# Patient Record
Sex: Female | Born: 1970
Health system: Southern US, Community
[De-identification: ages and names within clinical notes are randomized; demographics above are authoritative.]

## PROBLEM LIST (undated history)

## (undated) DIAGNOSIS — D649 Anemia, unspecified: Secondary | ICD-10-CM

## (undated) DIAGNOSIS — F419 Anxiety disorder, unspecified: Secondary | ICD-10-CM

## (undated) DIAGNOSIS — M069 Rheumatoid arthritis, unspecified: Secondary | ICD-10-CM

## (undated) DIAGNOSIS — Z803 Family history of malignant neoplasm of breast: Secondary | ICD-10-CM

## (undated) DIAGNOSIS — Z923 Personal history of irradiation: Secondary | ICD-10-CM

## (undated) DIAGNOSIS — C50911 Malignant neoplasm of unspecified site of right female breast: Secondary | ICD-10-CM

## (undated) DIAGNOSIS — Z800 Family history of malignant neoplasm of digestive organs: Secondary | ICD-10-CM

## (undated) DIAGNOSIS — R112 Nausea with vomiting, unspecified: Secondary | ICD-10-CM

## (undated) DIAGNOSIS — R630 Anorexia: Secondary | ICD-10-CM

## (undated) DIAGNOSIS — Z170 Estrogen receptor positive status [ER+]: Secondary | ICD-10-CM

## (undated) DIAGNOSIS — Z9889 Other specified postprocedural states: Secondary | ICD-10-CM

## (undated) DIAGNOSIS — C50919 Malignant neoplasm of unspecified site of unspecified female breast: Secondary | ICD-10-CM

## (undated) HISTORY — DX: Estrogen receptor positive status (ER+): Z17.0

## (undated) HISTORY — DX: Anemia, unspecified: D64.9

## (undated) HISTORY — DX: Family history of malignant neoplasm of breast: Z80.3

## (undated) HISTORY — DX: Anorexia: R63.0

## (undated) HISTORY — DX: Anxiety disorder, unspecified: F41.9

## (undated) HISTORY — DX: Rheumatoid arthritis, unspecified: M06.9

## (undated) HISTORY — DX: Malignant neoplasm of unspecified site of right female breast: C50.911

## (undated) HISTORY — DX: Nausea with vomiting, unspecified: R11.2

## (undated) HISTORY — DX: Family history of malignant neoplasm of digestive organs: Z80.0

---

## 1997-07-11 HISTORY — PX: COLPOSCOPY: SHX161

## 2000-08-21 ENCOUNTER — Other Ambulatory Visit: Admission: RE | Admit: 2000-08-21 | Discharge: 2000-08-21 | Payer: Self-pay | Admitting: Obstetrics and Gynecology

## 2001-08-21 ENCOUNTER — Other Ambulatory Visit: Admission: RE | Admit: 2001-08-21 | Discharge: 2001-08-21 | Payer: Self-pay | Admitting: Obstetrics and Gynecology

## 2002-10-14 ENCOUNTER — Other Ambulatory Visit: Admission: RE | Admit: 2002-10-14 | Discharge: 2002-10-14 | Payer: Self-pay | Admitting: Obstetrics and Gynecology

## 2002-10-21 ENCOUNTER — Encounter: Payer: Self-pay | Admitting: Obstetrics and Gynecology

## 2002-10-21 ENCOUNTER — Encounter: Admission: RE | Admit: 2002-10-21 | Discharge: 2002-10-21 | Payer: Self-pay | Admitting: Obstetrics and Gynecology

## 2003-10-27 ENCOUNTER — Other Ambulatory Visit: Admission: RE | Admit: 2003-10-27 | Discharge: 2003-10-27 | Payer: Self-pay | Admitting: Obstetrics and Gynecology

## 2004-10-29 ENCOUNTER — Other Ambulatory Visit: Admission: RE | Admit: 2004-10-29 | Discharge: 2004-10-29 | Payer: Self-pay | Admitting: Obstetrics and Gynecology

## 2005-11-01 ENCOUNTER — Other Ambulatory Visit: Admission: RE | Admit: 2005-11-01 | Discharge: 2005-11-01 | Payer: Self-pay | Admitting: Obstetrics and Gynecology

## 2006-04-11 ENCOUNTER — Ambulatory Visit (HOSPITAL_COMMUNITY): Admission: RE | Admit: 2006-04-11 | Discharge: 2006-04-11 | Payer: Self-pay | Admitting: *Deleted

## 2006-04-11 ENCOUNTER — Encounter (INDEPENDENT_AMBULATORY_CARE_PROVIDER_SITE_OTHER): Payer: Self-pay | Admitting: *Deleted

## 2006-04-13 ENCOUNTER — Other Ambulatory Visit: Admission: RE | Admit: 2006-04-13 | Discharge: 2006-04-13 | Payer: Self-pay | Admitting: Obstetrics & Gynecology

## 2007-08-30 ENCOUNTER — Other Ambulatory Visit: Admission: RE | Admit: 2007-08-30 | Discharge: 2007-08-30 | Payer: Self-pay | Admitting: Obstetrics & Gynecology

## 2008-09-04 ENCOUNTER — Other Ambulatory Visit: Admission: RE | Admit: 2008-09-04 | Discharge: 2008-09-04 | Payer: Self-pay | Admitting: Obstetrics & Gynecology

## 2009-05-11 HISTORY — PX: HYSTEROSCOPY WITH RESECTOSCOPE: SHX5395

## 2009-05-25 ENCOUNTER — Encounter: Payer: Self-pay | Admitting: Obstetrics & Gynecology

## 2009-05-25 ENCOUNTER — Ambulatory Visit (HOSPITAL_COMMUNITY): Admission: RE | Admit: 2009-05-25 | Discharge: 2009-05-25 | Payer: Self-pay | Admitting: Obstetrics & Gynecology

## 2010-05-14 ENCOUNTER — Encounter: Admission: RE | Admit: 2010-05-14 | Discharge: 2010-05-14 | Payer: Self-pay | Admitting: Obstetrics & Gynecology

## 2010-10-10 HISTORY — PX: APPENDECTOMY: SHX54

## 2010-10-13 LAB — URINALYSIS, ROUTINE W REFLEX MICROSCOPIC
Bilirubin Urine: NEGATIVE
Hgb urine dipstick: NEGATIVE
Nitrite: NEGATIVE
Protein, ur: NEGATIVE mg/dL
Specific Gravity, Urine: <1.005 — ABNORMAL LOW (ref 1.005–1.030)
Urobilinogen, UA: 0.2 mg/dL (ref 0.0–1.0)

## 2010-10-13 LAB — CBC
HCT: 40.2 % (ref 36.0–46.0)
Platelets: 176 10*3/uL (ref 150–400)
RDW: 13 % (ref 11.5–15.5)
WBC: 4.3 10*3/uL (ref 4.0–10.5)

## 2010-10-13 LAB — HCG, SERUM, QUALITATIVE: Preg, Serum: NEGATIVE

## 2010-11-04 ENCOUNTER — Other Ambulatory Visit: Payer: Self-pay | Admitting: General Surgery

## 2010-11-04 ENCOUNTER — Observation Stay (HOSPITAL_COMMUNITY)
Admission: EM | Admit: 2010-11-04 | Discharge: 2010-11-05 | Disposition: A | Discharge status: Home / Self Care | Payer: Managed Care, Other (non HMO) | Attending: General Surgery | Admitting: General Surgery

## 2010-11-04 DIAGNOSIS — M069 Rheumatoid arthritis, unspecified: Secondary | ICD-10-CM | POA: Insufficient documentation

## 2010-11-04 DIAGNOSIS — K3580 Unspecified acute appendicitis: Principal | ICD-10-CM | POA: Insufficient documentation

## 2010-11-04 DIAGNOSIS — R112 Nausea with vomiting, unspecified: Secondary | ICD-10-CM | POA: Insufficient documentation

## 2010-11-04 LAB — BASIC METABOLIC PANEL
BUN: 7 mg/dL (ref 6–23)
CO2: 23 mEq/L (ref 19–32)
Calcium: 9.1 mg/dL (ref 8.4–10.5)
Creatinine, Ser: 0.78 mg/dL (ref 0.4–1.2)
GFR calc non Af Amer: >60 mL/min (ref >60)
Glucose, Bld: 90 mg/dL (ref 70–99)

## 2010-11-04 LAB — DIFFERENTIAL
Basophils Absolute: 0 10*3/uL (ref 0.0–0.1)
Eosinophils Absolute: 0 10*3/uL (ref 0.0–0.7)
Eosinophils Relative: 0 % (ref 0–5)
Lymphocytes Relative: 43 % (ref 12–46)
Lymphs Abs: 3.1 10*3/uL (ref 0.7–4.0)
Neutrophils Relative %: 49 % (ref 43–77)

## 2010-11-04 LAB — URINE MICROSCOPIC-ADD ON

## 2010-11-04 LAB — CBC
MCV: 86.5 fL (ref 78.0–100.0)
Platelets: 208 10*3/uL (ref 150–400)
RBC: 4.75 MIL/uL (ref 3.87–5.11)
RDW: 12.6 % (ref 11.5–15.5)
WBC: 7.1 10*3/uL (ref 4.0–10.5)

## 2010-11-04 LAB — URINALYSIS, ROUTINE W REFLEX MICROSCOPIC
Leukocytes, UA: NEGATIVE
Nitrite: NEGATIVE
Protein, ur: NEGATIVE mg/dL
Specific Gravity, Urine: 1.035 — ABNORMAL HIGH (ref 1.005–1.030)
Urobilinogen, UA: 0.2 mg/dL (ref 0.0–1.0)

## 2010-11-15 NOTE — Op Note (Signed)
[name], Rose [name]        ACCOUNT NO.:  [id number]  MEDICAL RECORD NO.:  [id number]           PATIENT TYPE:  O  LOCATION:  5508                         FACILITY:  [facility]  PHYSICIAN:  Cherylynn Ridges, M.D.    DATE OF BIRTH:  18-Feb-1971  DATE OF PROCEDURE:  11/04/2010 DATE OF DISCHARGE:                              OPERATIVE REPORT   PREOPERATIVE DIAGNOSIS:  Acute appendicitis.  POSTOPERATIVE DIAGNOSIS:  Acute suppurative appendicitis.  PROCEDURE:  Laparoscopic appendectomy.  SURGEON:  Cherylynn Ridges, MD  ANESTHESIA:  General endotracheal.  ESTIMATED BLOOD LOSS:  Less than 20 mL.  COMPLICATIONS:  None.  CONDITION:  Stable.  FINDINGS:  An acutely inflamed appendix without perforation.  INDICATIONS FOR OPERATION:  The patient is a 40 year old with abdominal pain since Sunday, now localized to the right lower quadrant likely as acute appendicitis.  She did have appendicitis confirmed by CT.  OPERATION:  The patient was taken to the operating room and placed on table in supine position.  After an adequate general endotracheal anesthetic was administered, she was prepped and draped in usual sterile manner exposing the midline in the lower abdomen.  After proper time-out was performed identifying the patient and procedure to be performed, I made an upper supraumbilical midline incision with #15 blade down to the midline fascia.  We incised the fascia with 15-blade and grabbed the edges with Kocher clamps.  We then dissected down into the peritoneal cavity with minimal difficulty.  Once this was done, a pursestring suture of 0 Vicryl was passed around the fascial opening that secured and a Hasson cannula which was subsequently passed into the peritoneal cavity.  Once this was done, a right upper quadrant 5-mm cannula and a left lower quadrant 12-mm cannula were passed under direct vision.  Once all cannulas were in place, the patient was placed in Trendelenburg and the  left-side was tilted down.  The acutely inflamed appendix was coursed from the base of the cecum medially and posteriorly.  We able to mobilize it at its base initially taking the mesoappendix with a 2.5-mm white Endo-GIA.  We then cleared up the appendix down to the base of the cecum where we came across it with a 3.5-mm blue Endo-GIA.  We removed the appendix from the left lower quadrant site using an EndoCatch bag.  We then irrigated with about a liter and half of saline solution.  There was minimal bleeding, adequate hemostasis.  We aspirated all fluid and gas from above the patient when she was in the neutral position.  Then, we removed all cannulas.  The supraumbilical fascial site was closed using a pursestring suture, which was in place.  Once this was done, we injected all sites with 0.25% Marcaine with epi.  The skin in the left lower quadrant and the supraumbilical site were closed using a running subcuticular stitch of 4- 0 Monocryl.  Dermabond, Steri-Strips, and Tegaderm were used to complete all dressings.  All needle counts, sponge counts and instrument counts were correct.     Cherylynn Ridges, M.D.     JOW/MEDQ  D:  11/04/2010  T:  11/05/2010  Job:  [id number]  Electronically Signed by Jimmye Norman M.D. on 11/15/2010 04:12:46 PM

## 2010-11-15 NOTE — Discharge Summary (Signed)
  Rose [name], Rose [name]        ACCOUNT NO.:  [id number]  MEDICAL RECORD NO.:  [id number]           PATIENT TYPE:  O  LOCATION:  5508                         FACILITY:  [facility]  PHYSICIAN:  Cherylynn Ridges, M.D.    DATE OF BIRTH:  [birth year]  DATE OF ADMISSION:  11/04/2010 DATE OF DISCHARGE:  11/05/2010                              DISCHARGE SUMMARY   DISCHARGE DIAGNOSES: 1. Acute appendicitis. 2. Rheumatoid arthritis.  HISTORY:  This is a 40 year old African American female who had several day history of right lower quadrant abdominal pain.  She developed some projectile vomiting on Sunday and saw her primary care provider on Monday. She remained anorexic and returned to her primary care provider again on Wednesday of this week.  She continued to have nausea, vomiting and was sent for CT scan which showed an acute appendicitis.  She was admitted and underwent laparoscopic appendectomy with findings of an acutely inflamed appendix without evidence of perforation per Dr. Jimmye Norman.  The following morning, the patient was doing well.  She was tolerating a regular diet and passing flatus.  She had minimal residual right lower quadrant pain.  She will be discharged home with family.  Medications at time of discharge include Vicodin 1-2 p.o. q.4 h. p.r.n. pain #30, no refill.  She will follow up with Dr. Lindie Spruce in 2 weeks or sooner should she have difficulty in the interim.  She will continue on her usual home medications post discharge.     Lazaro Arms, P.A.   ______________________________ Cherylynn Ridges, M.D.    SR/MEDQ  D:  11/05/2010  T:  11/05/2010  Job:  [id number]  cc:   [facility]  Electronically Signed by Lazaro Arms P.A. on 11/09/2010 01:43:16 PM Electronically Signed by Jimmye Norman M.D. on 11/15/2010 04:12:51 PM

## 2010-11-26 NOTE — Op Note (Signed)
[name], Rose [name]        ACCOUNT NO.:  [id number]   MEDICAL RECORD NO.:  [id number]          PATIENT TYPE:  AMB   LOCATION:  ENDO                         FACILITY:  [facility]   PHYSICIAN:  Georgiana Spinner, M.D.    DATE OF BIRTH:  1971/05/09   DATE OF PROCEDURE:  04/11/2006  DATE OF DISCHARGE:                                 OPERATIVE REPORT   PROCEDURE:  Upper endoscopy with biopsy.   INDICATIONS:  Dysphagia.   ANESTHESIA:  Fentanyl 75 mcg, Versed 8 mg.   The patient states that her dysphagia has improved significantly since  starting on PPI and moving her doxycycline up from the previous night-time  dosing to before suppertime.   PROCEDURE:  With the patient mildly sedated in the left lateral decubitus  position, the Olympus videoscopic endoscope was inserted in the mouth and  passed under direct vision through the esophagus which appeared normal.  There was no evidence of stricture or Barrett's esophagus or esophagitis  seen.  We entered into the stomach.  Fundus and body appeared normal.  Antrum showed some erythematous changes in a linear array that were  photographed and biopsied.  We entered in the duodenal bulb and second  portion of duodenum.  Both appeared normal.  From this point, the endoscope  was slowly withdrawn, taking circumferential views of duodenal mucosa until  the endoscope had been pulled back into the stomach and placed in  retroflexion to view the stomach from below.  The endoscope was straightened  and withdrawn, taking circumferential views of the remaining gastric and  esophageal mucosa.  The patient's vital signs and pulse oximeter remained  stable.  The patient tolerated the procedure well without apparent  complications.   FINDINGS:  Erythematous antrum, biopsied.  Await biopsy report.  The patient  will call me for results and follow-up with me as an outpatient.           ______________________________  Georgiana Spinner, M.D.     GMO/MEDQ   D:  04/11/2006  T:  04/12/2006  Job:  [id number]

## 2010-12-14 ENCOUNTER — Encounter (INDEPENDENT_AMBULATORY_CARE_PROVIDER_SITE_OTHER): Payer: Self-pay | Admitting: General Surgery

## 2011-04-21 ENCOUNTER — Other Ambulatory Visit: Payer: Self-pay | Admitting: Obstetrics & Gynecology

## 2011-04-21 DIAGNOSIS — Z1231 Encounter for screening mammogram for malignant neoplasm of breast: Secondary | ICD-10-CM

## 2011-05-16 ENCOUNTER — Ambulatory Visit
Admission: RE | Admit: 2011-05-16 | Discharge: 2011-05-16 | Disposition: A | Discharge status: Home / Self Care | Payer: Managed Care, Other (non HMO) | Source: Ambulatory Visit | Attending: Obstetrics & Gynecology | Admitting: Obstetrics & Gynecology

## 2011-05-16 DIAGNOSIS — Z1231 Encounter for screening mammogram for malignant neoplasm of breast: Secondary | ICD-10-CM

## 2012-04-16 ENCOUNTER — Other Ambulatory Visit: Payer: Self-pay | Admitting: Obstetrics & Gynecology

## 2012-04-16 DIAGNOSIS — Z1231 Encounter for screening mammogram for malignant neoplasm of breast: Secondary | ICD-10-CM

## 2012-05-21 ENCOUNTER — Ambulatory Visit
Admission: RE | Admit: 2012-05-21 | Discharge: 2012-05-21 | Disposition: A | Discharge status: Home / Self Care | Payer: Managed Care, Other (non HMO) | Source: Ambulatory Visit | Attending: Obstetrics & Gynecology | Admitting: Obstetrics & Gynecology

## 2012-05-21 DIAGNOSIS — Z1231 Encounter for screening mammogram for malignant neoplasm of breast: Secondary | ICD-10-CM

## 2013-01-02 ENCOUNTER — Encounter: Payer: Self-pay | Admitting: Obstetrics & Gynecology

## 2013-01-03 ENCOUNTER — Ambulatory Visit: Payer: Self-pay | Admitting: Obstetrics & Gynecology

## 2013-01-08 ENCOUNTER — Ambulatory Visit: Payer: Self-pay | Admitting: Obstetrics & Gynecology

## 2013-01-17 ENCOUNTER — Ambulatory Visit (INDEPENDENT_AMBULATORY_CARE_PROVIDER_SITE_OTHER): Payer: Managed Care, Other (non HMO) | Admitting: Obstetrics & Gynecology

## 2013-01-17 ENCOUNTER — Encounter: Payer: Self-pay | Admitting: Obstetrics & Gynecology

## 2013-01-17 VITALS — BP 112/70 | HR 60 | Resp 16 | Ht 61.0 in | Wt 135.0 lb

## 2013-01-17 DIAGNOSIS — Z01419 Encounter for gynecological examination (general) (routine) without abnormal findings: Secondary | ICD-10-CM

## 2013-01-17 DIAGNOSIS — R1031 Right lower quadrant pain: Secondary | ICD-10-CM

## 2013-01-17 MED ORDER — VALACYCLOVIR HCL 1 G PO TABS: 500.0000 mg | ORAL_TABLET | Freq: Every day | ORAL | Status: DC

## 2013-01-17 MED ORDER — FLUCONAZOLE 150 MG PO TABS: 150.0000 mg | ORAL_TABLET | Freq: Once | ORAL | Status: DC

## 2013-01-17 NOTE — Progress Notes (Signed)
42 y.o. G0P0000 MarriedAfrican AmericanF here for annual exam.  Having worse right lower quadrant pain starting about two days before cycle, stays for two days and then goes away.  This is much worse.  Going to [place] in August.  They are meeting her parents in [place].    Doing well with medications for R.A.  Has had three yeast infections--side effect of Orencia.  She would like a standing RX if possible for diflucan.  Joints feel good, though.  Patient's last menstrual period was 01/13/2013.          Sexually active: yes  The current method of family planning is none.    Exercising: yes  cardio, strength training, and walking Smoker:  no  Health Maintenance: Pap:  12/08/11 WNL/negative HR HPV History of abnormal Pap:  no MMG:  11/13 3D normal Colonoscopy:  none BMD:   9/08 TDaP:  2006 Screening Labs: brought with her (scanned), Hb today: PCP, Urine today: PCP   reports that she has never smoked. She has never used smokeless tobacco. She reports that she drinks about 2.5 ounces of alcohol per week. She reports that she does not use illicit drugs.  Past Medical History  Diagnosis Date  . Chronic rheumatic arthritis   . Abdominal pain   . Poor appetite   . N&V (nausea and vomiting)     Past Surgical History  Procedure Laterality Date  . Appendectomy  4/12  . Hysteroscopy with resectoscope  11/10  . Colposcopy  1999    Current Outpatient Prescriptions  Medication Sig Dispense Refill  . Abatacept (ORENCIA) 125 MG/ML SOLN Inject into the skin.      Marland Kitchen acetaminophen (TYLENOL ARTHRITIS PAIN) 650 MG CR tablet Take 650 mg by mouth every 8 (eight) hours as needed for pain.      . clindamycin-tretinoin (ZIANA) gel Apply topically daily.        . Dapsone (ACZONE) 5 % topical gel Apply topically 2 (two) times daily.      . fish oil-omega-3 fatty acids 1000 MG capsule Take 1,000 mg by mouth 2 (two) times daily.        . hydroxychloroquine (PLAQUENIL) 200 MG tablet Take by mouth 2 (two)  times daily.        . Multiple Vitamin (ONCE DAILY PO) Take by mouth 1 dose over 24 hours.        . Prenatal MV-Min-Fe Fum-FA-DHA (PRENATAL 1 PO) Take by mouth daily.        . valACYclovir (VALTREX) 1000 MG tablet Take 1,000 mg by mouth as needed.       No current facility-administered medications for this visit.    No family history on file.  ROS:  Pertinent items are noted in HPI.  Otherwise, a comprehensive ROS was negative.  Exam:   BP 112/70  Pulse 60  Resp 16  Ht 5\' 1"  (1.549 m)  Wt 135 lb (61.236 kg)  BMI 25.52 kg/m2  LMP 01/13/2013  Weight change: +4 lb  Height: 5\' 1"  (154.9 cm)  Ht Readings from Last 3 Encounters:  01/17/13 5\' 1"  (1.549 m)    General appearance: alert, cooperative and appears stated age Head: Normocephalic, without obvious abnormality, atraumatic Neck: no adenopathy, supple, symmetrical, trachea midline and thyroid normal to inspection and palpation Lungs: clear to auscultation bilaterally Breasts: normal appearance, no masses or tenderness Heart: regular rate and rhythm Abdomen: soft, non-tender; bowel sounds normal; no masses,  no organomegaly Extremities: extremities normal, atraumatic, no cyanosis or  edema Skin: Skin color, texture, turgor normal. No rashes or lesions Lymph nodes: Cervical, supraclavicular, and axillary nodes normal. No abnormal inguinal nodes palpated Neurologic: Grossly normal   Pelvic: External genitalia:  no lesions              Urethra:  normal appearing urethra with no masses, tenderness or lesions              Bartholins and Skenes: normal                 Vagina: normal appearing vagina with normal color and discharge, no lesions              Cervix: no lesions              Pap taken: no Bimanual Exam:  Uterus:  normal size, contour, position, consistency, mobility, non-tender              Adnexa: normal adnexa and no mass, fullness, tenderness               Rectovaginal: Confirms               Anus:  normal  sphincter tone, no lesions  A:  Well Woman with normal exam H/O small uterine fibroid R.A. H/O HSV 2 H/O complext ovarian cyst, question endometriosis, with worsening RLQ pain  P:   Mammogram yearly pap smear with HR HPV 1 year ago. Return for PUS Rx for Valtrex 500mg  prn outbreaks to pharmacy.  Pt has not used any in past year. return annually or prn  An After Visit Summary was printed and given to the patient.

## 2013-01-17 NOTE — Patient Instructions (Signed)

## 2013-01-22 ENCOUNTER — Other Ambulatory Visit: Payer: Self-pay | Admitting: Obstetrics & Gynecology

## 2013-01-22 NOTE — Telephone Encounter (Signed)
RX sent

## 2013-01-22 NOTE — Telephone Encounter (Signed)
eScribe request for refill on PRENATAL VITAMIN Last filled - 01/17/12 X 1 YEAR Last AEX - 01/17/13 Next AEX - 04/03/14 Dr. Hyacinth Meeker out of office.  Please advise refill.  Thank you.

## 2013-01-22 NOTE — Telephone Encounter (Signed)
OK to refill for 1 year. 

## 2013-01-28 ENCOUNTER — Ambulatory Visit (INDEPENDENT_AMBULATORY_CARE_PROVIDER_SITE_OTHER): Payer: Managed Care, Other (non HMO)

## 2013-01-28 ENCOUNTER — Ambulatory Visit (INDEPENDENT_AMBULATORY_CARE_PROVIDER_SITE_OTHER): Payer: Managed Care, Other (non HMO) | Admitting: Obstetrics & Gynecology

## 2013-01-28 DIAGNOSIS — D251 Intramural leiomyoma of uterus: Secondary | ICD-10-CM

## 2013-01-28 DIAGNOSIS — R1031 Right lower quadrant pain: Secondary | ICD-10-CM

## 2013-01-28 DIAGNOSIS — R9389 Abnormal findings on diagnostic imaging of other specified body structures: Secondary | ICD-10-CM

## 2013-01-28 DIAGNOSIS — N946 Dysmenorrhea, unspecified: Secondary | ICD-10-CM

## 2013-01-28 DIAGNOSIS — N83209 Unspecified ovarian cyst, unspecified side: Secondary | ICD-10-CM

## 2013-01-28 DIAGNOSIS — D259 Leiomyoma of uterus, unspecified: Secondary | ICD-10-CM

## 2013-01-28 DIAGNOSIS — N831 Corpus luteum cyst of ovary, unspecified side: Secondary | ICD-10-CM

## 2013-01-28 NOTE — Progress Notes (Signed)
42 y.o. Bary Richard here for a pelvic ultrasound due to pelvic pain and worsening dysmenorrhea.  Patient does have family hx of endometriosis (in sister who has undergone previous hysterectomy).  Patient has unexplained infertility.  She is not on any contraception.  She is requiring more and more medication for pain around her cycle.  She describes the pain as most intense at the very beginning of her cycle or the day before her cycle starts.  Pain continues for the first two to three days and then improves.  Flow can be heavy at times but the pain is the major issue.  She and I have discussed possible laparoscopy for further evaluation.  She is getting ready to go on trip to [place] with spouse and parents.    Patient's last menstrual period was 01/13/2013.  Sexually active:  yes  Contraception: no method  FINDINGS: UTERUS: 8.6 x  5.3 x 4.4cm with 1.4cm intramural fibroid and cortical cysts.  Possible adenomyosis.   EMS: 9.80mm ADNEXA:   Left ovary 3.5 x 2.4 x 2.6cm with 18 x 19mm corpus luteal cyst   Right ovary 3.4 x 2.5 x 1.3cm CUL DE SAC: neg for free fluid  Findings discussed with patient.  No clear findings consistent with endometriosis.  However, probable adenomyosis present.  She is not interested, at this time, in IUD or endometrial ablation.  Endometrium is a little thickened but due to no birth control use, feel sonohysterography is not appropriate.  She is most likely going to proceed with laparoscopy and I can perform hysterosopy at the same time if necessary.  Procedure, risks, recovery discussed with patient.  She is going to call after she gets back from her trip and will probably proceed with surgery in late September or October.  Again, she does request rx for pain medication--which she has never asked for in the past--due to long plane flight which will probably be during her cycle.    Assessment:  Pelvic pain, dysmenorrhea, possible adenomyosis, intramural fibroid  Plan: Pt  will call after she returned from [facility] to schedule diagnostic laparoscopy with possible cauterization of endometriosis, hysteroscopy with possible polyp resection.  This will need to be timed with menstrual cycle as she is and continues to be off birth control.  Also, will need pre-op appointment.  RX for Norco 5/325mg  #30/0RF given.  ~25 minutes spent with patient >50% of time was in face to face discussion of above.

## 2013-01-30 ENCOUNTER — Encounter: Payer: Self-pay | Admitting: Obstetrics & Gynecology

## 2013-03-08 ENCOUNTER — Telehealth: Payer: Self-pay | Admitting: Obstetrics & Gynecology

## 2013-03-08 NOTE — Telephone Encounter (Signed)
Pt says she need to schedule a follow-up appointment with Dr. Hyacinth Meeker.

## 2013-03-08 NOTE — Telephone Encounter (Signed)
Return call to patient, she is ready to proceed with scheduling surgery for after 03-29-13.  Will send to insurance and then schedule after pt aware of OOP cost.   Per office visit note from 01-28-13, schedule for Diag Laparoscopy with poss LOA, hysteroscopy and polyp resect, D&C correct? Will schedule preop once surgery date scheduled.

## 2013-03-09 NOTE — Telephone Encounter (Signed)
Correct.  Thanks. 

## 2013-03-12 NOTE — Telephone Encounter (Signed)
Chart to precert dept.

## 2013-03-13 ENCOUNTER — Telehealth: Payer: Self-pay | Admitting: Obstetrics & Gynecology

## 2013-03-13 NOTE — Telephone Encounter (Signed)
Can you bring me her old chart and Tresa Endo can call her tomorrow?  Thanks.

## 2013-03-13 NOTE — Telephone Encounter (Signed)
Spoke with patient about her surgery benefits. Patient would like to discuss it with her husband and her mother before deciding. (Mother is a Engineer, civil (consulting)) Patient will call back when ready to proceed with scheduling. Instructed patient to ask for me and if I am not available to ask for [facility].

## 2013-03-13 NOTE — Telephone Encounter (Signed)
Patient was calling back in regards to her surgery. Patient would like try birth control to see if it helps with the pain before going the surgery route. Patient said she had a long recovery period from last surgery and would like to exhaust all other options before undergoing another surgery. She is okay with scheduling surgery if Dr. Hyacinth Meeker thinks it is the best option and that birth control will only prolong the inevitable. Routing to Dr. Hyacinth Meeker.

## 2013-04-01 ENCOUNTER — Telehealth: Payer: Self-pay

## 2013-04-01 NOTE — Telephone Encounter (Signed)
Lmtcb//kn 

## 2013-04-02 ENCOUNTER — Other Ambulatory Visit: Payer: Self-pay

## 2013-04-02 MED ORDER — NORETHINDRONE ACET-ETHINYL EST 1-20 MG-MCG PO TABS: 1.0000 | ORAL_TABLET | Freq: Every day | ORAL | Status: DC

## 2013-04-02 NOTE — Telephone Encounter (Signed)
Yes.  This is fine.

## 2013-04-02 NOTE — Telephone Encounter (Signed)
Spoke with patient, is aware can start loestrin 1/20. She would like to do this. rx ordered, will you make sure it is correct? Also, is it ok that she starts it on the first day of her cycle? Appointment made for 3 month f/u on 06/27/13. Please advise.

## 2013-04-18 ENCOUNTER — Other Ambulatory Visit: Payer: Self-pay

## 2013-04-18 DIAGNOSIS — Z1231 Encounter for screening mammogram for malignant neoplasm of breast: Secondary | ICD-10-CM

## 2013-04-25 NOTE — Telephone Encounter (Signed)
Chief Complaint  Patient presents with  . Advice Only    Pt wants to talk with the nurse pt states she has some questions

## 2013-04-25 NOTE — Telephone Encounter (Signed)
Patient calling wondering how she should take LoEstrin 1/20.  She has 3 weeks worth of pills. With no inactive pills.  Advised 1 tablet daily for 21 days then, 7 tablet free days. Advised to start new pack after the 7 tablet free days. Patient verbalized understanding.   Routing to provider for review.

## 2013-04-26 NOTE — Telephone Encounter (Signed)
You advised her correctly.  Thanks.

## 2013-05-27 ENCOUNTER — Ambulatory Visit
Admission: RE | Admit: 2013-05-27 | Discharge: 2013-05-27 | Disposition: A | Discharge status: Home / Self Care | Payer: Managed Care, Other (non HMO) | Source: Ambulatory Visit

## 2013-05-27 DIAGNOSIS — Z1231 Encounter for screening mammogram for malignant neoplasm of breast: Secondary | ICD-10-CM

## 2013-06-25 ENCOUNTER — Other Ambulatory Visit: Payer: Self-pay | Admitting: Obstetrics & Gynecology

## 2013-06-26 NOTE — Telephone Encounter (Signed)
Last refilled: 04/02/13 #1 pack with 2 refills was sent Last AEX: 01/17/13 AEX scheduled for 04/03/14  Please advise.

## 2013-06-27 ENCOUNTER — Ambulatory Visit (INDEPENDENT_AMBULATORY_CARE_PROVIDER_SITE_OTHER): Payer: Managed Care, Other (non HMO) | Admitting: Obstetrics & Gynecology

## 2013-06-27 ENCOUNTER — Encounter: Payer: Self-pay | Admitting: Obstetrics & Gynecology

## 2013-06-27 VITALS — BP 120/76 | HR 80 | Resp 16 | Ht 60.75 in | Wt 137.0 lb

## 2013-06-27 DIAGNOSIS — M069 Rheumatoid arthritis, unspecified: Secondary | ICD-10-CM

## 2013-06-27 DIAGNOSIS — N946 Dysmenorrhea, unspecified: Secondary | ICD-10-CM

## 2013-06-27 NOTE — Progress Notes (Signed)
42 y.o. Married [place] American female G0P0000 here for follow up after starting OCPs for dysmenorrhea.  I have felt for years that patient probably has endometriosis.  She has had ovarian findings   Has been having some increased headaches.  No migraines.  No nausea.  No blood pressure issues.  Pain is so much much better.  She can't believe that the pill helped so much.  Cycles are lighter as well.  Flow was 3 days.  No changes in blood pressure.     O: Healthy WD,WN female Affect: normal  A:Dysmenorrhea  P: Continue Junel.  Rx to pharmacy yesterday.  Pt is aware that I want to know if she has any changes to her headaches.  She is aware of increased risk of stroke with migraines with auras.  No recent auras. Rheumatoid arthritis  ~15 minutes spent with patient >50% of time was in face to face discussion of above.

## 2013-10-23 ENCOUNTER — Encounter: Payer: Self-pay | Admitting: Obstetrics & Gynecology

## 2014-02-03 ENCOUNTER — Other Ambulatory Visit: Payer: Self-pay | Admitting: Obstetrics & Gynecology

## 2014-02-04 NOTE — Telephone Encounter (Signed)
Last AEX: 01/17/13 Last refill:01/22/13 #30, 11 rfs Current AEX:04/03/14  Please advise

## 2014-04-03 ENCOUNTER — Encounter: Payer: Self-pay | Admitting: Obstetrics & Gynecology

## 2014-04-03 ENCOUNTER — Ambulatory Visit (INDEPENDENT_AMBULATORY_CARE_PROVIDER_SITE_OTHER): Payer: Managed Care, Other (non HMO) | Admitting: Obstetrics & Gynecology

## 2014-04-03 VITALS — BP 110/78 | HR 60 | Resp 16 | Ht 60.75 in | Wt 137.6 lb

## 2014-04-03 DIAGNOSIS — Z124 Encounter for screening for malignant neoplasm of cervix: Secondary | ICD-10-CM

## 2014-04-03 DIAGNOSIS — Z01419 Encounter for gynecological examination (general) (routine) without abnormal findings: Secondary | ICD-10-CM

## 2014-04-03 MED ORDER — NORETHIN-ETH ESTRAD-FE BIPHAS 1 MG-10 MCG / 10 MCG PO TABS: 1.0000 | ORAL_TABLET | Freq: Every day | ORAL | Status: DC

## 2014-04-03 NOTE — Progress Notes (Signed)
43 y.o. G0P0000 MarriedAfrican AmericanF here for annual exam.  Has had some elevated BPs with Dr. Earnest Bailey.  She is checking her BPs at home.  These are usually normal.  Since starting OCPs, cycles are normal.  She is having some increased frequency of migraines.  She has noticed this more when she is on her placebo week.    Husband did go for evaluation of infertility.  Husband's semen analysis.     Patient's last menstrual period was 03/27/2014.          Sexually active: Yes.    The current method of family planning is OCP (estrogen/progesterone).    Exercising: Yes.    cardio, strength, and core Smoker:  no  Health Maintenance: Pap:  12/08/11 WNL/negative HR HPV History of abnormal Pap:  no MMG:  05/27/13-normal Colonoscopy:  none BMD:   2009-normal TDaP:  11/12/04 Screening Labs: PCP, Hb today: PCP, Urine today: PCP   reports that she has never smoked. She has never used smokeless tobacco. She reports that she drinks about 3 ounces of alcohol per week. She reports that she does not use illicit drugs.  Past Medical History  Diagnosis Date  . Chronic rheumatic arthritis   . Abdominal pain   . Poor appetite   . N&V (nausea and vomiting)     Past Surgical History  Procedure Laterality Date  . Appendectomy  4/12  . Hysteroscopy with resectoscope  11/10  . Colposcopy  1999    Current Outpatient Prescriptions  Medication Sig Dispense Refill  . Abatacept (ORENCIA) 125 MG/ML SOLN Inject into the skin.      Marland Kitchen acetaminophen (TYLENOL) 500 MG tablet Take 500 mg by mouth every 6 (six) hours as needed.      . clindamycin-tretinoin (ZIANA) gel Apply topically daily.        . Dapsone (ACZONE) 5 % topical gel Apply topically 2 (two) times daily.      . fish oil-omega-3 fatty acids 1000 MG capsule Take 1,000 mg by mouth 2 (two) times daily.        . hydroxychloroquine (PLAQUENIL) 200 MG tablet Take by mouth 2 (two) times daily.        Lenda Kelp 1/20 1-20 MG-MCG tablet TAKE 1 TABLET BY MOUTH  DAILY.  21 tablet  12  . Multiple Vitamin (ONCE DAILY PO) Take by mouth 1 dose over 24 hours.        . Prenatal Vit-Fe Fumarate-FA (PNV PRENATAL PLUS MULTIVITAMIN) 27-1 MG TABS TAKE 1 TABLET EVERY DAY  30 tablet  10  . valACYclovir (VALTREX) 1000 MG tablet Take 0.5 tablets (500 mg total) by mouth daily.  30 tablet  2  . fluconazole (DIFLUCAN) 150 MG tablet Take 1 tablet (150 mg total) by mouth once. Take one tablet.  Repeat in 48 hours if symptoms are not completely resolved.  2 tablet  2   No current facility-administered medications for this visit.    History reviewed. No pertinent family history.  ROS:  Pertinent items are noted in HPI.  Otherwise, a comprehensive ROS was negative.  Exam:   BP 110/78  Pulse 60  Resp 16  Ht 5' 0.75" (1.543 m)  Wt 137 lb 9.6 oz (62.415 kg)  BMI 26.22 kg/m2  LMP 03/27/2014   Height: 5' 0.75" (154.3 cm)  Ht Readings from Last 3 Encounters:  04/03/14 5' 0.75" (1.543 m)  06/27/13 5' 0.75" (1.543 m)  01/17/13 5\' 1"  (1.549 m)    General appearance: alert, cooperative  and appears stated age Head: Normocephalic, without obvious abnormality, atraumatic Neck: no adenopathy, supple, symmetrical, trachea midline and thyroid normal to inspection and palpation Lungs: clear to auscultation bilaterally Breasts: normal appearance, no masses or tenderness Heart: regular rate and rhythm Abdomen: soft, non-tender; bowel sounds normal; no masses,  no organomegaly Extremities: extremities normal, atraumatic, no cyanosis or edema Skin: Skin color, texture, turgor normal. No rashes or lesions Lymph nodes: Cervical, supraclavicular, and axillary nodes normal. No abnormal inguinal nodes palpated Neurologic: Grossly normal   Pelvic: External genitalia:  no lesions              Urethra:  normal appearing urethra with no masses, tenderness or lesions              Bartholins and Skenes: normal                 Vagina: normal appearing vagina with normal color and  discharge, no lesions              Cervix: no lesions              Pap taken: Yes.   Bimanual Exam:  Uterus:  normal size, contour, position, consistency, mobility, non-tender              Adnexa: normal adnexa and no mass, fullness, tenderness               Rectovaginal: Confirms               Anus:  normal sphincter tone, no lesions  A:  Well Woman with normal exam  H/O small uterine fibroid  R.A.  H/O HSV 2  H/O pelvic pain that I have often thought was endometriosis.  No laparoscopy have ever seen.  P: Mammogram yearly  Pap today.  Pap with neg HR HPV 2013 Rx for Valtrex 1 prn outbreaks to pharmacy. Pt has not used any in past year.  Will change OCP to LoLoestrin.  Rx to pharmacy.  Will check with pt about BP in 3-4 months. return annually or prn  An After Visit Summary was printed and given to the patient.

## 2014-04-08 LAB — IPS PAP TEST WITH REFLEX TO HPV

## 2014-04-25 ENCOUNTER — Telehealth: Payer: Self-pay | Admitting: Obstetrics & Gynecology

## 2014-04-25 NOTE — Telephone Encounter (Signed)
Patient started menses  early on the last pack of 28 day birth control pill. Patient says this is unusual to start menses soon. Does she need to start new pack Sunday or Wednesday?

## 2014-04-25 NOTE — Telephone Encounter (Signed)
Spoke with patient. Patient usually has week of placebo week from Wednesday to Wednesday. Started cycle earlier this cycle. With LMP 04/20/14. Patient wanted to know if she can start new pack of LoLoestrin this Sunday or does she have to wait until Wednesday to start new pack. Patient changing from [place] to [employer]  Advised patient can start new pack on Sunday. Advised not covered contraceptively, patient states not using ocp for contraception at this time. Advised patient to not skip pills on final week of Loloestrin and take all pills in pack.  She is verbalizes understanding.  Advised would call back if Dr. Sabra Heck has any changes to instructions. Routing to provider for final review. Patient agreeable to disposition. Will close encounter

## 2014-05-21 ENCOUNTER — Other Ambulatory Visit: Payer: Self-pay

## 2014-05-21 DIAGNOSIS — Z1231 Encounter for screening mammogram for malignant neoplasm of breast: Secondary | ICD-10-CM

## 2014-06-16 ENCOUNTER — Ambulatory Visit
Admission: RE | Admit: 2014-06-16 | Discharge: 2014-06-16 | Disposition: A | Discharge status: Home / Self Care | Payer: Managed Care, Other (non HMO) | Source: Ambulatory Visit

## 2014-06-16 DIAGNOSIS — Z1231 Encounter for screening mammogram for malignant neoplasm of breast: Secondary | ICD-10-CM

## 2014-07-17 ENCOUNTER — Encounter: Payer: Self-pay | Admitting: Nurse Practitioner

## 2014-07-17 ENCOUNTER — Telehealth: Payer: Self-pay | Admitting: Obstetrics & Gynecology

## 2014-07-17 ENCOUNTER — Ambulatory Visit (INDEPENDENT_AMBULATORY_CARE_PROVIDER_SITE_OTHER): Payer: Managed Care, Other (non HMO) | Admitting: Nurse Practitioner

## 2014-07-17 VITALS — BP 130/80 | HR 74 | Temp 98.1°F | Resp 16 | Ht 60.75 in | Wt 139.0 lb

## 2014-07-17 DIAGNOSIS — R300 Dysuria: Secondary | ICD-10-CM

## 2014-07-17 LAB — POCT URINALYSIS DIPSTICK
Bilirubin, UA: NEGATIVE
Blood, UA: 250
GLUCOSE UA: NEGATIVE
Ketones, UA: NEGATIVE
Leukocytes, UA: NEGATIVE
NITRITE UA: NEGATIVE
PROTEIN UA: NEGATIVE
UROBILINOGEN UA: NEGATIVE
pH, UA: 5

## 2014-07-17 MED ORDER — NITROFURANTOIN MONOHYD MACRO 100 MG PO CAPS: 100.0000 mg | ORAL_CAPSULE | Freq: Two times a day (BID) | ORAL | Status: DC

## 2014-07-17 NOTE — Telephone Encounter (Signed)
Call to home x 2, left message to return call.

## 2014-07-17 NOTE — Telephone Encounter (Signed)
Spoke with patient. Reports one day of dysuria and odor to urine. No fevers or flank pain.  Office visit for today at 1415 with Rose [name], [place].  Patient agreeable.  Routing to provider for final review. Patient agreeable to disposition. Will close encounter

## 2014-07-17 NOTE — Progress Notes (Signed)
S:  44 y.o.Married AA female G25 presents with complaint of UTI. Symptoms began 5 days ago. With symptoms of burning at beginning of urine stream.  Some odor with urine and darker than usual . She has not been on any new med's. Pertinent negatives include: having no constitutional symptoms, denying fever, chills, anorexia, or weight loss.. Sexually active yes  Symptoms related to post coital No.   Current method of birth control OCP Lo Loestrin.  Same partner without change.  This menses started last Wednesday and now lasting for a week. Changing pad or tampons every 4 hours.  She continues to monitor BP at home,  today 112/82. Denies vaginal discharge.  ROS: no weight loss, fever, night sweats and feels well  O: alert, oriented to person, place, and time, normal mood, behavior, speech, dress, motor activity, and thought processes   healthy,  alert and  not in acute distress  mild  No CVA tenderness  Pelvic: deferred   Diagnostic Test:    Urinalysis: 250 RBC   urine culture  Assessment: R/O UTI   Plan:  Maintain adequate hydration. Follow up if symptoms not improving, and as needed.    Follow with labs   Medication Therapy: Macrobid 100 mg BID # 14   Lab:TOC if Urine Culture is positive    RV

## 2014-07-17 NOTE — Telephone Encounter (Signed)
Patient calling stating, "I think I have a urinary tract infection." She is available to be seen today. Please advise?

## 2014-07-17 NOTE — Patient Instructions (Signed)

## 2014-07-18 LAB — URINALYSIS, MICROSCOPIC ONLY
Bacteria, UA: NONE SEEN
Casts: NONE SEEN
Squamous Epithelial / LPF: NONE SEEN

## 2014-07-20 NOTE — Progress Notes (Signed)
Encounter reviewed by Dr. Brook Silva.  

## 2014-07-30 ENCOUNTER — Telehealth: Payer: Self-pay | Admitting: *Deleted

## 2014-07-30 NOTE — Telephone Encounter (Signed)
Return call to Stephanie. °

## 2014-07-30 NOTE — Telephone Encounter (Signed)
-----   Message from Milford Cage, [place] sent at 07/21/2014  7:00 PM EST ----- Please let patient know that there was an error with the lab about her urine culture not completed as ordered.   She is to finish the med's. If any symptoms remain to return for a TOC.

## 2014-07-30 NOTE — Telephone Encounter (Signed)
Pt notified in result note.  Closing encounter. 

## 2014-07-30 NOTE — Telephone Encounter (Signed)
I have attempted to contact this patient by phone with the following results: left message to return call to [place] at [phone] on answering machine (mobile per [facility]).  Advised call was regarding labs and if she did not speak with me today, not to worry overnight..  [phone] (Mobile) *Preferred*

## 2014-09-22 ENCOUNTER — Telehealth: Payer: Self-pay | Admitting: Obstetrics & Gynecology

## 2014-09-22 NOTE — Telephone Encounter (Signed)
Spoke with patient. Patient states that Rose [name] has already returned her phone call. Apologized for duplicate call. Advised to return call with any further questions or needs. Patient is agreeabl.  Routing to provider for final review. Patient agreeable to disposition. Will close encounter

## 2014-09-22 NOTE — Telephone Encounter (Signed)
Patient states she is returning a call to [place].

## 2014-09-25 ENCOUNTER — Other Ambulatory Visit: Payer: Self-pay

## 2014-09-25 NOTE — Telephone Encounter (Signed)
-----   Message from Megan Salon, MD sent at 09/22/2014 11:56 AM EDT ----- I think we need to switch to micronor.  It will help lower her BP and then hopefully she won't need BP meds.  MSM  ----- Message -----    From: Robley Fries, CMA    Sent: 09/22/2014  11:46 AM      To: Megan Salon, MD  Spoke with patient-states BP is still elevated. It has been running about 125/80-130/83 in the morning, then at work 140/90, and in the evening before bed 120/80. She did speak with her PCP to let them know and that we thought it might be due to the [facility], so she was going on a lower dose. She has an appointment with PCP in 7/16 and was going to discuss it with them, they are aware she does not want to go on BP meds. Also, is on arthritis medication and was going to call the doctor that prescribed that, due to her joints still hurting. But, the lower dose of OCP has helped her cramping. Patient is aware I will give the information to Dr Sabra Heck and will call her back with any changes. Please advise.//kn ----- Message -----    From: Megan Salon, MD    Sent: 08/25/2014   9:47 AM      To: Maryann Conners, CMA  Pt's BPs were mildly elevated on OCPs so I switched her to lower dose.  Can you check to see if she is checking these and what are the numbers?  Thanks.  MSM

## 2014-09-25 NOTE — Telephone Encounter (Signed)
Patient returned call. Pharmacy [facility] file is correct[facility]

## 2014-09-25 NOTE — Telephone Encounter (Signed)
Left detailed message-need to verify pharmacy.//kn

## 2014-09-30 MED ORDER — NORETHINDRONE 0.35 MG PO TABS: 1.0000 | ORAL_TABLET | Freq: Every day | ORAL | Status: DC

## 2014-09-30 NOTE — Telephone Encounter (Signed)
Patient notified of change. Is going to finish the Lo Loestrin that she currently has and then transition straight into the Micronor. AEX scheduled for 05/19/15. Aware to continue taking BP at home and to give update in a couple of months, unless Dr Sabra Heck has any other recommendations. Will you also verify medication is correct.//kn

## 2014-11-03 ENCOUNTER — Other Ambulatory Visit: Payer: Self-pay | Admitting: Obstetrics & Gynecology

## 2014-11-04 NOTE — Telephone Encounter (Signed)
Medication refill request: Valtrex 1000 mg Last AEX:  04/03/14 SM Next AEX: 05/19/15 SM Last MMG (if hormonal medication request): 06/17/14 BIRADS1:Neg Refill authorized: 01/17/13 #30tabs/ 2R. Today #30tabs/2R?

## 2015-01-17 ENCOUNTER — Other Ambulatory Visit: Payer: Self-pay | Admitting: Obstetrics & Gynecology

## 2015-01-19 NOTE — Telephone Encounter (Signed)
Medication refill request: Prenatal Vit Fe Last AEX:  04/03/2014 with SM Next AEX: 05/19/15 with SM Last MMG (if hormonal medication request): 06/17/14 Bi rads C 1 neg Refill authorized: Please advise.

## 2015-05-14 ENCOUNTER — Other Ambulatory Visit: Payer: Self-pay

## 2015-05-14 DIAGNOSIS — Z1231 Encounter for screening mammogram for malignant neoplasm of breast: Secondary | ICD-10-CM

## 2015-05-19 ENCOUNTER — Encounter: Payer: Self-pay | Admitting: Obstetrics & Gynecology

## 2015-05-19 ENCOUNTER — Ambulatory Visit (INDEPENDENT_AMBULATORY_CARE_PROVIDER_SITE_OTHER): Payer: Managed Care, Other (non HMO) | Admitting: Obstetrics & Gynecology

## 2015-05-19 VITALS — BP 138/82 | HR 68 | Resp 16 | Ht 61.0 in | Wt 139.0 lb

## 2015-05-19 DIAGNOSIS — Z124 Encounter for screening for malignant neoplasm of cervix: Secondary | ICD-10-CM

## 2015-05-19 DIAGNOSIS — Z01419 Encounter for gynecological examination (general) (routine) without abnormal findings: Secondary | ICD-10-CM | POA: Diagnosis not present

## 2015-05-19 MED ORDER — NORETHINDRONE 0.35 MG PO TABS: 1.0000 | ORAL_TABLET | Freq: Every day | ORAL | Status: DC

## 2015-05-19 NOTE — Progress Notes (Signed)
44 y.o. G0P0 MarriedAfrican AmericanF here for annual exam.  Doing well.  Pt reports headaches are better since she started on the progesterone only pills.  Pt reports her cramping is better as well.  Flow is slow and dark for first day, then increases and is heavy for another day or two and then is very light.  Overall bleeding is about 7 days but it much better overall.    Pt is having some fluctuating BPs.  She checks it about three times a day.  Seeing Dr. West Carbo next week.  Will get flu shot when she sees him.  PCP:  Dr. Ashby Dawes.  Seen in July.  Labs done then.  Pt reports here results were all good.    Patient's last menstrual period was 05/07/2015.          Sexually active: No.   This is primarily due to husband's issues. The current method of family planning is oral progesterone-only contraceptive.    Exercising: Yes.    cardio, strength, core training Smoker:  no  Health Maintenance: Pap:  04/04/14 WNL, neg pap with neg HR HPV 2013 History of abnormal Pap:  no MMG:  06/16/14 3D-BiRads 1 negative Colonoscopy:  none BMD:   2009-normal TDaP:  Will check with PCP-may have gotten at last AEX Screening Labs: PCP, Hb today: PCP, Urine today: PCP   reports that she has never smoked. She has never used smokeless tobacco. She reports that she drinks about 3.0 - 3.6 oz of alcohol per week. She reports that she does not use illicit drugs.  Past Medical History  Diagnosis Date  . Chronic rheumatic arthritis ([place])   . Abdominal pain   . Poor appetite   . N&V (nausea and vomiting)     Past Surgical History  Procedure Laterality Date  . Appendectomy  4/12  . Hysteroscopy with resectoscope  11/10  . Colposcopy  1999    Current Outpatient Prescriptions  Medication Sig Dispense Refill  . Abatacept (ORENCIA) 125 MG/ML SOLN Inject into the skin. Every 4 weeks    . acetaminophen (TYLENOL) 500 MG tablet Take 650 mg by mouth every 6 (six) hours as needed.     . clindamycin-tretinoin  (ZIANA) gel Apply topically daily.      . Dapsone (ACZONE) 5 % topical gel Apply topically 2 (two) times daily.    . fish oil-omega-3 fatty acids 1000 MG capsule Take 1,000 mg by mouth 2 (two) times daily.      . hydroxychloroquine (PLAQUENIL) 200 MG tablet Take by mouth 2 (two) times daily.      . Multiple Vitamin (ONCE DAILY PO) Take by mouth 1 dose over 24 hours.      . norethindrone (ORTHO MICRONOR) 0.35 MG tablet Take 1 tablet (0.35 mg total) by mouth daily. 3 Package 2  . Prenatal Vit-Fe Fumarate-FA (PNV PRENATAL PLUS MULTIVITAMIN) 27-1 MG TABS TAKE 1 TABLET EVERY DAY 30 tablet 10  . valACYclovir (VALTREX) 1000 MG tablet TAKE 1/2 TABLET BY MOUTH DAILY 30 tablet 2  . nitrofurantoin, macrocrystal-monohydrate, (MACROBID) 100 MG capsule Take 1 capsule (100 mg total) by mouth 2 (two) times daily. (Patient not taking: Reported on 05/19/2015) 14 capsule 0   No current facility-administered medications for this visit.    No family history on file.  ROS:  Pertinent items are noted in HPI.  Otherwise, a comprehensive ROS was negative.  Exam:   General appearance: alert, cooperative and appears stated age Head: Normocephalic, without obvious abnormality, atraumatic  Neck: no adenopathy, supple, symmetrical, trachea midline and thyroid normal to inspection and palpation Lungs: clear to auscultation bilaterally Breasts: normal appearance, no masses or tenderness Heart: regular rate and rhythm Abdomen: soft, non-tender; bowel sounds normal; no masses,  no organomegaly Extremities: extremities normal, atraumatic, no cyanosis or edema Skin: Skin color, texture, turgor normal. No rashes or lesions Lymph nodes: Cervical, supraclavicular, and axillary nodes normal. No abnormal inguinal nodes palpated Neurologic: Grossly normal   Pelvic: External genitalia:  no lesions              Urethra:  normal appearing urethra with no masses, tenderness or lesions              Bartholins and Skenes: normal                  Vagina: normal appearing vagina with normal color and discharge, no lesions              Cervix: no lesions              Pap taken: Yes.   Bimanual Exam:  Uterus:  normal size, contour, position, consistency, mobility, non-tender              Adnexa: normal adnexa and no mass, fullness, tenderness               Rectovaginal: Confirms               Anus:  normal sphincter tone, no lesions  Chaperone was present for exam.  A:  Well Woman with normal exam  H/O small uterine fibroid  R.A.  H/O HSV 2  H/O pelvic pain that I have often thought was endometriosis. Has never had a laparoscopy. Fluctuating blood pressure  P: Mammogram yearly  Pap today. Pap with neg HR HPV 2013 Rx for Valtrex not needed yet.  Pt will call when she needs RF.   Rx for micronor to pharmacy.  #90 day supply/4RF Labs with Dr. Saunders Revel and Dr. Amil Amen Pt plans to discuss with PCP at follow-up.  Has been keeping record of BPs to take with her. return annually or prn

## 2015-05-21 LAB — IPS PAP TEST WITH REFLEX TO HPV

## 2015-06-18 ENCOUNTER — Ambulatory Visit: Payer: Managed Care, Other (non HMO)

## 2015-07-20 ENCOUNTER — Ambulatory Visit
Admission: RE | Admit: 2015-07-20 | Discharge: 2015-07-20 | Disposition: A | Discharge status: Home / Self Care | Payer: Managed Care, Other (non HMO) | Source: Ambulatory Visit

## 2015-07-20 DIAGNOSIS — Z1231 Encounter for screening mammogram for malignant neoplasm of breast: Secondary | ICD-10-CM

## 2015-07-22 ENCOUNTER — Other Ambulatory Visit: Payer: Self-pay | Admitting: Obstetrics & Gynecology

## 2015-07-22 DIAGNOSIS — R928 Other abnormal and inconclusive findings on diagnostic imaging of breast: Secondary | ICD-10-CM

## 2015-07-27 ENCOUNTER — Ambulatory Visit
Admission: RE | Admit: 2015-07-27 | Discharge: 2015-07-27 | Disposition: A | Discharge status: Home / Self Care | Payer: Managed Care, Other (non HMO) | Source: Ambulatory Visit | Attending: Obstetrics & Gynecology | Admitting: Obstetrics & Gynecology

## 2015-07-27 DIAGNOSIS — R928 Other abnormal and inconclusive findings on diagnostic imaging of breast: Secondary | ICD-10-CM

## 2015-08-20 ENCOUNTER — Telehealth: Payer: Self-pay | Admitting: Obstetrics & Gynecology

## 2015-08-20 MED ORDER — HYDROCODONE-ACETAMINOPHEN 5-325 MG PO TABS: 1.0000 | ORAL_TABLET | Freq: Four times a day (QID) | ORAL | Status: DC | PRN

## 2015-08-20 NOTE — Telephone Encounter (Signed)
Spoke with patient. Patient states that Dr.Miller prescribed her Norco in the past for her painful cramps with her menses. Patient states she has been using this sparingly since it was prescribed in 2014. Reports she just ran out of the prescription. Requesting a refill at this time. Last aex 05/19/2015 with Dr.Miller.  Routing to [place] for review and advise.

## 2015-08-20 NOTE — Telephone Encounter (Signed)
Patient requesting refill of pain medication that Dr. Sabra Heck gives her for cramps. Best # to reach: [phone]  [id number] [address], [place] [address] Phone: [phone] Fax: [phone]

## 2015-08-20 NOTE — Telephone Encounter (Signed)
Rx printed and signed for pt.  She will have to come and pick it up as we cannot fax it or send it electronically. #30/0RF

## 2015-08-21 NOTE — Telephone Encounter (Signed)
Spoke with patient. Advised of message as seen below from [place]. She is agreeable and will come by the office this afternoon to pick up the prescription. Rx placed in a sealed envelope with her name and DOB and to the front for pick up today.  Routing to provider for final review. Patient agreeable to disposition. Will close encounter.

## 2015-08-21 NOTE — Telephone Encounter (Signed)
Left message to call Rose [name] at [phone]. 

## 2015-11-23 ENCOUNTER — Telehealth: Payer: Self-pay | Admitting: Obstetrics & Gynecology

## 2015-11-23 DIAGNOSIS — N939 Abnormal uterine and vaginal bleeding, unspecified: Secondary | ICD-10-CM

## 2015-11-23 NOTE — Telephone Encounter (Signed)
Spoke with patient. She is currently on Micronor and has been taking her pills daily. No missed or late pills. Not sexually active. Her cycle is regular, usually, and comes every 25 or 26 days and lasts for about 5 days. This month, her cycle was one week late and started on 11/13/15 and continues today. She has felt some pressure in her vaginal area, she states that this pain is different as her pain during cycles is typically in upper pelvic area, she reports this pain feels "lower in the vaginal area."  She denies heavy bleeding, states that today she is having spotting only, changing her pad q 4-6 hours, but called with concerns about an episode on Saturday 5/12 where she developed increased pain in vaginal area and passed what patient describes as clots while she was standing in the shower. Using Tylenol for pain at this time and patient reports this helps.   Patient offered office visit with Dr. Sabra Heck and she declines at this time. Requests advice from Dr. Sabra Heck. Advised patient to continue monitoring her cycles.  Patient denies any dizziness, weakness, lightheadedness. She states she is aware that she has a fibroid.   Patient advised will send message to Dr. Sabra Heck and return call with response/instrutions. Patient agreeable.

## 2015-11-23 NOTE — Telephone Encounter (Signed)
Call to patient. She is given message from Dr. Sabra Heck and agreeable to schedule ultrasound at this time.   Ultrasound scheduled for 11/26/15 at 1300 with consult following.  She is advised that insurance coordinator will call her back with insurance coverage information. She states she may need to change date or time, advised okay if needed, just needs to let them know if needs to change to next week.  Patient advised to call back if any additional concerns prior to appointment or if develops Advised patient to call back or seek immediate medical care if bleeding worsens or soaking through 1 pad/tampon per hour for two hours or if becomes symptomatic with sob, chest pain, fatigued, lightheaded, or weakness.  Patient verbalized understanding.  cc Deloris Ping Dixon/Becky Frahm for insurance pre-certification, referral processing and patient contact.

## 2015-11-23 NOTE — Telephone Encounter (Signed)
Last ultrasound was 2014.  I think it is appropriate to repeat it.  I can do exam and also make additional recommendations at that time.

## 2015-11-23 NOTE — Telephone Encounter (Signed)
Patient is calling to talk to Dr.Miller's nurse regarding her irregular cycles.

## 2015-11-26 ENCOUNTER — Ambulatory Visit (INDEPENDENT_AMBULATORY_CARE_PROVIDER_SITE_OTHER): Payer: Managed Care, Other (non HMO)

## 2015-11-26 ENCOUNTER — Ambulatory Visit (INDEPENDENT_AMBULATORY_CARE_PROVIDER_SITE_OTHER): Payer: Managed Care, Other (non HMO) | Admitting: Obstetrics & Gynecology

## 2015-11-26 VITALS — BP 126/70 | HR 68 | Resp 16 | Ht 61.0 in | Wt 139.0 lb

## 2015-11-26 DIAGNOSIS — D251 Intramural leiomyoma of uterus: Secondary | ICD-10-CM | POA: Diagnosis not present

## 2015-11-26 DIAGNOSIS — N939 Abnormal uterine and vaginal bleeding, unspecified: Secondary | ICD-10-CM

## 2015-11-26 DIAGNOSIS — N946 Dysmenorrhea, unspecified: Secondary | ICD-10-CM

## 2015-11-26 DIAGNOSIS — M069 Rheumatoid arthritis, unspecified: Secondary | ICD-10-CM

## 2015-11-26 NOTE — Progress Notes (Signed)
45 y.o. G0P0 Married [name] here for pelvic ultrasound due to abnormal bleeding with this last cycle.  Pt with known history of fibroids.  I have also suspected endometriosis in the past.  Pt has declined laparoscopy in the past, several times.  She has been on micronor for several years with control of cycles and pain.  Pt also with hx of RA and is on  immunosuppressants.  Last menstrual cycle was two weeks late.  When it started, there was a lot of spotting and what looked like tissue that passed.  Pt then reports bleeding became bright red and this lasted for two days.  When this stopped, the spotting started again (brownish looking) and stopped yesterday.  This was 11 days all total of bleeding.  None of it was heavy.  As she has not had a PUS since 2014, this was recommended.  Pt is here for this today.  Last pap was 11/16 and was normal.  No LMP recorded.  Contraception: Micronor  Findings:  UTERUS: 9.4 x 5.3 x 5.5cm with several fibroids measuring 2.8 x 1.8cm, 0.9 x 0.6cm, 1.6 x 1.2cm, 2.3 x 1.9cm, and 0.7 x 1.0cm EMS:4.53mm with possible 20mm encroaching fibroid ADNEXA: Left ovary: 2.8 x 1.1 x 1.7cm       Right ovary: 3.4 x 1.9 x 1.9cm CUL DE SAC: no free fluid in cul de sac but mild amt of clear fluid noted around left adnexa  Discussion:  Number and side of fibroids has significantly increased in the last three years.  Images of older ultrasound and current one reviewed with pt today.  As she's only had one cycle of abnormal bleeding and is on a generic pill, I think it is ok to continue with this medication.  However, if irregular bleeding continues, hysteroscopy with possible fibroid resection may be beneficial to her.  She has tried, in the past, to avoid procedures if necessary and she is desirous of waiting and watching at this time.  She's going to [place], [place], for a girls' trip with her sister and cousin next week so she likely will not be bleeding for this  trip.  Pt aware to call back if irregular bleeding continues.  Information about fibroids provided.  Pt is trying to just get to menopause so all the bleeding will stop.  She is aware, that I will try not to use HRT with her due to her fibroids in hopes that they will shrink and overall uterine size will decrease at well.  Today, she is comfortable with this plan and hopes to not be on HRT as well.  Assessment:  Prolonged last cycle with DUB Uterine fibroids, enlarging and increased number since prior PUS 2014 Rheumatoid Arthritis hx  Plan:  Pt will continues on current micronor and call with any irregular bleeding. Return for AEX as previously scheduled  ~25 minutes spent with patient >50% of time was in face to face discussion of above.

## 2015-11-30 ENCOUNTER — Encounter: Payer: Self-pay | Admitting: Obstetrics & Gynecology

## 2015-11-30 DIAGNOSIS — D251 Intramural leiomyoma of uterus: Secondary | ICD-10-CM | POA: Insufficient documentation

## 2016-01-05 ENCOUNTER — Other Ambulatory Visit: Payer: Self-pay | Admitting: Obstetrics & Gynecology

## 2016-01-06 NOTE — Telephone Encounter (Signed)
Medication refill request: Prenatal Vit-Fe Last AEX:  05/19/15 SM Next AEX: 09/13/16 Last MMG (if hormonal medication request): 07/20/15 BIRADS0; 07/27/15 Dx & U/S BIRADS1 Refill authorized: 01/19/2015. #30 10R. Please advise. Thank you.

## 2016-06-29 ENCOUNTER — Other Ambulatory Visit: Payer: Self-pay | Admitting: Obstetrics & Gynecology

## 2016-06-29 DIAGNOSIS — Z1231 Encounter for screening mammogram for malignant neoplasm of breast: Secondary | ICD-10-CM

## 2016-07-27 ENCOUNTER — Other Ambulatory Visit: Payer: Self-pay | Admitting: Obstetrics & Gynecology

## 2016-07-29 NOTE — Telephone Encounter (Signed)
Medication refill request: Norethindrone Last AEX:  05/19/15 SM Next AEX: 09/13/16 SM Last MMG (if hormonal medication request): 07/20/15 BIRADS0, [facility]; 07/27/15 Dx & U/S R Breast BIRADS1, [facility] (Scheduled for 08/08/16) Refill authorized: 05/19/15 #3 Packages 4R. Please advise. Thank you.

## 2016-08-08 ENCOUNTER — Ambulatory Visit: Payer: Managed Care, Other (non HMO)

## 2016-09-12 ENCOUNTER — Ambulatory Visit
Admission: RE | Admit: 2016-09-12 | Discharge: 2016-09-12 | Disposition: A | Discharge status: Home / Self Care | Payer: Managed Care, Other (non HMO) | Source: Ambulatory Visit | Attending: Obstetrics & Gynecology | Admitting: Obstetrics & Gynecology

## 2016-09-12 DIAGNOSIS — Z1231 Encounter for screening mammogram for malignant neoplasm of breast: Secondary | ICD-10-CM

## 2016-09-12 NOTE — Progress Notes (Signed)
46 y.o. G0P0 Married [place] F here for annual exam.  Doing well.  Her husband's mother died when they were in [place].  He's grieving at this time.  They moved homes last year as well.  So, they've had lots of stressors.    Cycles are regular on POPs.  Still has cramping that is significant.  Uses Vicodin occasionally and tylenol.  Has one day of clotting and two days of heavier flow and then tapers off.  Lasts about a week.    Rheumatologist:  Dr. Amil Amen  Patient's last menstrual period was 09/07/2016.          Sexually active: Yes.    The current method of family planning is OCP (estrogen/progesterone).    Exercising: Yes.    strength, core and cardio exercises Smoker:  no  Health Maintenance: Pap:  05/19/15 negative, 04/04/14 negative  History of abnormal Pap:  no MMG: 09/12/16 at [facility] Colonoscopy:  never BMD:   2009- normal  TDaP:  07/11/04- would like to update today  Pneumonia vaccine(s):  never Zostavax:   never Hep C testing: not indicated  Screening Labs: PCP, Hb today: PCP   reports that she has never smoked. She has never used smokeless tobacco. She reports that she drinks about 3.0 - 3.6 oz of alcohol per week . She reports that she does not use drugs.  Past Medical History:  Diagnosis Date  . Abdominal pain   . Chronic rheumatic arthritis ([place])   . N&V (nausea and vomiting)   . Poor appetite     Past Surgical History:  Procedure Laterality Date  . APPENDECTOMY  4/12  . COLPOSCOPY  1999  . HYSTEROSCOPY WITH RESECTOSCOPE  11/10    Current Outpatient Prescriptions  Medication Sig Dispense Refill  . Abatacept (ORENCIA) 125 MG/ML SOLN Inject into the skin. Every 4 weeks    . acetaminophen (TYLENOL) 500 MG tablet Take 650 mg by mouth every 6 (six) hours as needed.     Marland Kitchen CAMILA 0.35 MG tablet TAKE 1 TABLET (0.35 MG TOTAL) BY MOUTH DAILY. 84 tablet 0  . clindamycin-tretinoin (ZIANA) gel Apply topically daily.      . Dapsone (ACZONE) 5 % topical gel  Apply topically 2 (two) times daily.    . fish oil-omega-3 fatty acids 1000 MG capsule Take 1,000 mg by mouth 2 (two) times daily.      . fluticasone (FLONASE) 50 MCG/ACT nasal spray USE 2 SPRAYS IN EACH NOSTRIL ONCE DAILY AS NEEDED 30 DAYS  6  . HYDROcodone-acetaminophen (NORCO/VICODIN) 5-325 MG tablet Take 1-2 tablets by mouth every 6 (six) hours as needed for moderate pain or severe pain. 30 tablet 0  . hydroxychloroquine (PLAQUENIL) 200 MG tablet Take by mouth 2 (two) times daily.      . Multiple Vitamin (ONCE DAILY PO) Take by mouth 1 dose over 24 hours.      . Prenatal Vit-Fe Fumarate-FA (PNV PRENATAL PLUS MULTIVITAMIN) 27-1 MG TABS TAKE 1 TABLET EVERY DAY 30 tablet 13  . valACYclovir (VALTREX) 1000 MG tablet TAKE 1/2 TABLET BY MOUTH DAILY 30 tablet 2   No current facility-administered medications for this visit.     Family History  Problem Relation Age of Onset  . Breast cancer Maternal Aunt     ROS:  Pertinent items are noted in HPI.  Otherwise, a comprehensive ROS was negative.  Exam:   BP 110/72 (BP Location: Right Arm, Patient Position: Sitting, Cuff Size: Normal)   Pulse  70   Resp 14   Ht 5' 0.5" (1.537 m)   Wt 132 lb (59.9 kg)   LMP 09/07/2016   BMI 25.36 kg/m      Height: 5' 0.5" (153.7 cm)  Ht Readings from Last 3 Encounters:  09/13/16 5' 0.5" (1.537 m)  11/26/15 5\' 1"  (1.549 m)  05/19/15 5\' 1"  (1.549 m)    General appearance: alert, cooperative and appears stated age Head: Normocephalic, without obvious abnormality, atraumatic Neck: no adenopathy, supple, symmetrical, trachea midline and thyroid normal to inspection and palpation Lungs: clear to auscultation bilaterally Breasts: normal appearance, no masses or tenderness Heart: regular rate and rhythm Abdomen: soft, non-tender; bowel sounds normal; no masses,  no organomegaly Extremities: extremities normal, atraumatic, no cyanosis or edema Skin: Skin color, texture, turgor normal. No rashes or lesions Lymph  nodes: Cervical, supraclavicular, and axillary nodes normal. No abnormal inguinal nodes palpated Neurologic: Grossly normal   Pelvic: External genitalia:  no lesions              Urethra:  normal appearing urethra with no masses, tenderness or lesions              Bartholins and Skenes: normal                 Vagina: normal appearing vagina with normal color and discharge, no lesions              Cervix: no lesions              Pap taken: Yes.   Bimanual Exam:  Uterus:  normal size, contour, position, consistency, mobility, non-tender              Adnexa: normal adnexa and no mass, fullness, tenderness               Rectovaginal: Confirms               Anus:  normal sphincter tone, no lesions  Chaperone was present for exam.  A:  Well Woman with normal exam H/O small uterine fibroid H/O RA H/O HSV 2 Significant dysmenorrhea that I have felt was possibly due to endometriosis but pt has declined laparoscopy  P:   Mammogram guidelines reviewed pap smear obtained with HR HPV testing. Neg pap and neg HR HPV 2013 Rx for valtrex not needed.  Pt will call when it is needed. Rx for micronor, 1 po daily.  #90 day supply/RFs to pharmacy Lab work done regularly with Dr. Ashby Dawes and Dr. Amil Amen. Rx for Vicodin 5/325mg  1-2 po q6 hrs prn dysmenorrhea.  #30/0RF (pt has rx last all of last year but is aware if needs another one, she will have to come back into office. Voices clear understanding.  tdap updated today return annually or prn

## 2016-09-13 ENCOUNTER — Ambulatory Visit (INDEPENDENT_AMBULATORY_CARE_PROVIDER_SITE_OTHER): Payer: Managed Care, Other (non HMO) | Admitting: Obstetrics & Gynecology

## 2016-09-13 ENCOUNTER — Other Ambulatory Visit (HOSPITAL_COMMUNITY)
Admission: RE | Admit: 2016-09-13 | Discharge: 2016-09-13 | Disposition: A | Discharge status: Home / Self Care | Payer: Managed Care, Other (non HMO) | Source: Ambulatory Visit | Attending: Obstetrics & Gynecology | Admitting: Obstetrics & Gynecology

## 2016-09-13 ENCOUNTER — Encounter: Payer: Self-pay | Admitting: Obstetrics & Gynecology

## 2016-09-13 VITALS — BP 110/72 | HR 70 | Resp 14 | Ht 60.5 in | Wt 132.0 lb

## 2016-09-13 DIAGNOSIS — Z1151 Encounter for screening for human papillomavirus (HPV): Secondary | ICD-10-CM | POA: Insufficient documentation

## 2016-09-13 DIAGNOSIS — Z01419 Encounter for gynecological examination (general) (routine) without abnormal findings: Secondary | ICD-10-CM | POA: Diagnosis present

## 2016-09-13 DIAGNOSIS — N946 Dysmenorrhea, unspecified: Secondary | ICD-10-CM

## 2016-09-13 DIAGNOSIS — Z124 Encounter for screening for malignant neoplasm of cervix: Secondary | ICD-10-CM | POA: Diagnosis not present

## 2016-09-13 DIAGNOSIS — Z23 Encounter for immunization: Secondary | ICD-10-CM | POA: Diagnosis not present

## 2016-09-13 MED ORDER — HYDROCODONE-ACETAMINOPHEN 5-325 MG PO TABS: 1.0000 | ORAL_TABLET | Freq: Four times a day (QID) | ORAL | 0 refills | Status: DC | PRN

## 2016-09-13 MED ORDER — NORETHINDRONE 0.35 MG PO TABS: 1.0000 | ORAL_TABLET | Freq: Every day | ORAL | 4 refills | Status: DC

## 2016-09-14 ENCOUNTER — Other Ambulatory Visit: Payer: Self-pay | Admitting: Obstetrics & Gynecology

## 2016-09-14 DIAGNOSIS — R928 Other abnormal and inconclusive findings on diagnostic imaging of breast: Secondary | ICD-10-CM

## 2016-09-14 LAB — CYTOLOGY - PAP
Diagnosis: NEGATIVE
HPV: NOT DETECTED

## 2016-09-19 ENCOUNTER — Other Ambulatory Visit: Payer: Managed Care, Other (non HMO)

## 2016-09-29 ENCOUNTER — Ambulatory Visit
Admission: RE | Admit: 2016-09-29 | Discharge: 2016-09-29 | Disposition: A | Discharge status: Home / Self Care | Payer: Managed Care, Other (non HMO) | Source: Ambulatory Visit | Attending: Obstetrics & Gynecology | Admitting: Obstetrics & Gynecology

## 2016-09-29 DIAGNOSIS — R928 Other abnormal and inconclusive findings on diagnostic imaging of breast: Secondary | ICD-10-CM

## 2016-11-08 ENCOUNTER — Telehealth: Payer: Self-pay | Admitting: Obstetrics & Gynecology

## 2016-11-08 NOTE — Telephone Encounter (Signed)
Spoke with patient. Patient states she used an always wipe on Friday and was experiencing symptoms of yeast on Sunday 4/29. Patient reports burning on outside of urethra, itching and milky discharge. Patient denies pain, bleeding or urinary complaints. Recommended OV for evaluation, advised patient Dr. Sabra Heck is out of the office on 5/2 can schedule with covering provider, patient declined. Patient would like to scheduled on 5/3 with Dr. Sabra Heck. Patient scheduled for 5/3 at 10am. Patient is agreeable to date and time.  Routing to provider for final review. Patient is agreeable to disposition. Will close encounter.

## 2016-11-08 NOTE — Telephone Encounter (Signed)
Patient thinks she may a yeast infection and would like to talk with a nurse.. Patient declined an appointment today.

## 2016-11-10 ENCOUNTER — Encounter: Payer: Self-pay | Admitting: Obstetrics & Gynecology

## 2016-11-10 ENCOUNTER — Ambulatory Visit (INDEPENDENT_AMBULATORY_CARE_PROVIDER_SITE_OTHER): Payer: Managed Care, Other (non HMO) | Admitting: Obstetrics & Gynecology

## 2016-11-10 VITALS — BP 112/76 | HR 80 | Resp 16 | Wt 132.0 lb

## 2016-11-10 DIAGNOSIS — N898 Other specified noninflammatory disorders of vagina: Secondary | ICD-10-CM

## 2016-11-10 MED ORDER — FLUCONAZOLE 150 MG PO TABS
150.0000 mg | ORAL_TABLET | Freq: Once | ORAL | 0 refills | Status: AC
Start: 2016-11-10 — End: 2016-11-10

## 2016-11-10 NOTE — Progress Notes (Signed)
GYNECOLOGY  VISIT   HPI: 46 y.o. G0P0 Married [name] here for six day complaint of vaginal discharge and vulvar itching.  She reports she had a little loose stool issues and used Always wipes.  She felt this was the beginning of the irritation.  She and I already discussed not using these types of wipes and states "I knew better".  Denies urinary symptoms, pelvic pain, fever, and vaginal discharge.  GYNECOLOGIC HISTORY: Patient's last menstrual period was 10/26/2016. Contraception: micronor  Patient Active Problem List   Diagnosis Date Noted  . Intramural leiomyoma of uterus 11/30/2015  . Rheumatoid arthritis ([place]) 06/27/2013  . Dysmenorrhea 06/27/2013    Past Medical History:  Diagnosis Date  . Abdominal pain   . Chronic rheumatic arthritis ([place])   . N&V (nausea and vomiting)   . Poor appetite     Past Surgical History:  Procedure Laterality Date  . APPENDECTOMY  4/12  . COLPOSCOPY  1999  . HYSTEROSCOPY WITH RESECTOSCOPE  11/10    MEDS:  Reviewed in EPIC and UTD  ALLERGIES: Patient has no known allergies.  Family History  Problem Relation Age of Onset  . Breast cancer Maternal Aunt     SH:  Married, non smoker  Review of Systems  Genitourinary: Negative for dysuria, frequency, hematuria and urgency.       Vaginal discharge and vulvar itching    PHYSICAL EXAMINATION:    BP 112/76 (BP Location: Right Arm, Patient Position: Sitting, Cuff Size: Normal)   Pulse 80   Resp 16   Wt 132 lb (59.9 kg)   LMP 10/26/2016   BMI 25.36 kg/m     General appearance: alert, cooperative and appears stated age Abdomen: soft, non-tender; bowel sounds normal; no masses,  no organomegaly  Pelvic: External genitalia:  no lesions              Urethra:  normal appearing urethra with no masses, tenderness or lesions              Bartholins and Skenes: normal                 Vagina: normal appearing vagina with thicke whitish discharge,  no lesions  Cervix: no lesions              Bimanual Exam:  Uterus:  normal size, contour, position, consistency, mobility, non-tender              Adnexa: no mass, fullness, tenderness              Anus:  no lesions  Chaperone was present for exam.  Assessment: Vaginal discharge consistent with yeast  Plan: Affirm pending.  Will call pt with results. Diflucan 150mg  po x 1, repeat 72 hours.

## 2016-11-11 LAB — WET PREP BY MOLECULAR PROBE
Candida species: DETECTED — AB
Gardnerella vaginalis: NOT DETECTED
Trichomonas vaginosis: NOT DETECTED

## 2017-01-05 ENCOUNTER — Encounter (HOSPITAL_BASED_OUTPATIENT_CLINIC_OR_DEPARTMENT_OTHER): Payer: Self-pay | Admitting: *Deleted

## 2017-01-05 ENCOUNTER — Emergency Department (HOSPITAL_BASED_OUTPATIENT_CLINIC_OR_DEPARTMENT_OTHER)
Admission: EM | Admit: 2017-01-05 | Discharge: 2017-01-06 | Disposition: A | Discharge status: Home / Self Care | Payer: Managed Care, Other (non HMO) | Attending: Emergency Medicine | Admitting: Emergency Medicine

## 2017-01-05 ENCOUNTER — Emergency Department (HOSPITAL_BASED_OUTPATIENT_CLINIC_OR_DEPARTMENT_OTHER): Payer: Managed Care, Other (non HMO)

## 2017-01-05 DIAGNOSIS — S59911A Unspecified injury of right forearm, initial encounter: Secondary | ICD-10-CM | POA: Diagnosis present

## 2017-01-05 DIAGNOSIS — M79645 Pain in left finger(s): Secondary | ICD-10-CM | POA: Insufficient documentation

## 2017-01-05 DIAGNOSIS — Y92410 Unspecified street and highway as the place of occurrence of the external cause: Secondary | ICD-10-CM | POA: Diagnosis not present

## 2017-01-05 DIAGNOSIS — S40811A Abrasion of right upper arm, initial encounter: Secondary | ICD-10-CM | POA: Insufficient documentation

## 2017-01-05 DIAGNOSIS — Z79899 Other long term (current) drug therapy: Secondary | ICD-10-CM | POA: Diagnosis not present

## 2017-01-05 DIAGNOSIS — Y999 Unspecified external cause status: Secondary | ICD-10-CM | POA: Insufficient documentation

## 2017-01-05 DIAGNOSIS — Y9389 Activity, other specified: Secondary | ICD-10-CM | POA: Insufficient documentation

## 2017-01-05 MED ORDER — NAPROXEN 500 MG PO TABS: 500.0000 mg | ORAL_TABLET | Freq: Two times a day (BID) | ORAL | 0 refills | Status: DC

## 2017-01-05 MED ORDER — METHOCARBAMOL 500 MG PO TABS: 500.0000 mg | ORAL_TABLET | Freq: Every evening | ORAL | 0 refills | Status: DC | PRN

## 2017-01-05 NOTE — ED Notes (Signed)
Patient stated" Im going home and taking a shower can I put the guaze on my arm after that?" EMT showed patient how to apply bacitracin and guaze to wound. Patient stated she understood and had no questions.
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
## 2017-01-29 ENCOUNTER — Other Ambulatory Visit: Payer: Self-pay | Admitting: Obstetrics & Gynecology

## 2017-01-30 NOTE — Telephone Encounter (Signed)
Medication refill request: Prenatal  Last AEX:  09/13/16 SM Next AEX: 12/12/17  Last MMG (if hormonal medication request): 09/29/16 Diagnostic MM RIGHT Breast w/ [place] -- BIRADS 3 Probably Benign  Refill authorized: 01/06/16 #30 w/13 refills; today please advise

## 2017-02-02 ENCOUNTER — Emergency Department (HOSPITAL_BASED_OUTPATIENT_CLINIC_OR_DEPARTMENT_OTHER)
Admission: EM | Admit: 2017-02-02 | Discharge: 2017-02-02 | Disposition: A | Discharge status: Home / Self Care | Payer: Managed Care, Other (non HMO) | Attending: Emergency Medicine | Admitting: Emergency Medicine

## 2017-02-02 ENCOUNTER — Encounter (HOSPITAL_BASED_OUTPATIENT_CLINIC_OR_DEPARTMENT_OTHER): Payer: Self-pay | Admitting: *Deleted

## 2017-02-02 DIAGNOSIS — M79645 Pain in left finger(s): Secondary | ICD-10-CM | POA: Insufficient documentation

## 2017-02-02 DIAGNOSIS — Z79899 Other long term (current) drug therapy: Secondary | ICD-10-CM | POA: Diagnosis not present

## 2017-02-02 DIAGNOSIS — R202 Paresthesia of skin: Secondary | ICD-10-CM | POA: Diagnosis not present

## 2017-02-02 NOTE — Discharge Instructions (Signed)
Continue to wear the splint. May continue to use Tylenol, ibuprofen, naproxen as needed for pain. Follow up with a hand specialist. Call the number provided tomorrow. They will likely schedule you for an appointment sometime next week.
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
## 2017-02-22 ENCOUNTER — Other Ambulatory Visit: Payer: Self-pay | Admitting: Orthopedic Surgery

## 2017-02-22 DIAGNOSIS — S6990XA Unspecified injury of unspecified wrist, hand and finger(s), initial encounter: Secondary | ICD-10-CM

## 2017-02-23 ENCOUNTER — Other Ambulatory Visit: Payer: Self-pay | Admitting: Orthopedic Surgery

## 2017-02-23 ENCOUNTER — Other Ambulatory Visit: Payer: Self-pay | Admitting: Obstetrics & Gynecology

## 2017-02-23 DIAGNOSIS — N63 Unspecified lump in unspecified breast: Secondary | ICD-10-CM

## 2017-02-23 DIAGNOSIS — S6990XA Unspecified injury of unspecified wrist, hand and finger(s), initial encounter: Secondary | ICD-10-CM

## 2017-03-11 HISTORY — PX: BREAST BIOPSY: SHX20

## 2017-03-15 ENCOUNTER — Ambulatory Visit
Admission: RE | Admit: 2017-03-15 | Discharge: 2017-03-15 | Disposition: A | Discharge status: Home / Self Care | Payer: Managed Care, Other (non HMO) | Source: Ambulatory Visit | Attending: Obstetrics & Gynecology | Admitting: Obstetrics & Gynecology

## 2017-03-15 ENCOUNTER — Other Ambulatory Visit: Payer: Self-pay | Admitting: Obstetrics & Gynecology

## 2017-03-15 DIAGNOSIS — N63 Unspecified lump in unspecified breast: Secondary | ICD-10-CM

## 2017-03-17 ENCOUNTER — Ambulatory Visit
Admission: RE | Admit: 2017-03-17 | Discharge: 2017-03-17 | Disposition: A | Discharge status: Home / Self Care | Payer: Managed Care, Other (non HMO) | Source: Ambulatory Visit | Attending: Orthopedic Surgery | Admitting: Orthopedic Surgery

## 2017-03-17 DIAGNOSIS — S6990XA Unspecified injury of unspecified wrist, hand and finger(s), initial encounter: Secondary | ICD-10-CM

## 2017-03-17 MED ORDER — IOPAMIDOL (ISOVUE-M 200) INJECTION 41%
1.0000 mL | Freq: Once | INTRAMUSCULAR | Status: AC
  Administered 2017-03-17: 1 mL via INTRA_ARTICULAR

## 2017-03-20 ENCOUNTER — Ambulatory Visit
Admission: RE | Admit: 2017-03-20 | Discharge: 2017-03-20 | Disposition: A | Discharge status: Home / Self Care | Payer: Managed Care, Other (non HMO) | Source: Ambulatory Visit | Attending: Obstetrics & Gynecology | Admitting: Obstetrics & Gynecology

## 2017-03-20 DIAGNOSIS — N63 Unspecified lump in unspecified breast: Secondary | ICD-10-CM

## 2017-03-23 ENCOUNTER — Telehealth: Payer: Self-pay

## 2017-03-23 DIAGNOSIS — N6452 Nipple discharge: Secondary | ICD-10-CM

## 2017-03-23 NOTE — Telephone Encounter (Signed)
[facility]. Order for bilateral breast MRI w wo contrast order placed for pre-authorization. Please see right breast biopsy results dated 03/20/2017 with these recommendations.  Routing to [place] for pre-authorization.

## 2017-03-27 NOTE — Telephone Encounter (Signed)
Returned call to patient to advise, the breast MRI order has been forwarded to [facility] for scheduling. Advised patient she should expect a call from their office for scheduling. I also advised patient we are in the process of obtaining prior approval from [place], for the requested MRI.  Patient understood the information presented in call.   cc: Dr Sabra Heck  cc: Reesa Chew, RN

## 2017-03-27 NOTE — Telephone Encounter (Signed)
Patient calling to check if a breast mri has been scheduled for her.

## 2017-03-30 NOTE — Telephone Encounter (Signed)
Received faxed documentation from [facility] on behalf of Rose [name], advising requested authorization for a bilateral breast MRI with/without contrast, has been denied. Letter and information to schedule a Peer to Peer review appointment has been forwarded to triage Nurse, Verline Lema Sprague, to coordinate.   Forwarding to Dr Sabra Heck  cc: Reesa Chew, RN

## 2017-03-30 NOTE — Telephone Encounter (Signed)
Call to [facility]. Spoke with Rema Fendt. Peer to peer review scheduled for 03/31/2017 at 12:30 pm EST. This will be with Dr.Jennifer Seaton. Dr.Seaton will contact the office through the back line at 12:30 pm EST to speak with Dr.Miller. Routing to [place] as Juluis Rainier. All notes and documentation to [place] as well.  Cc: Lerry Liner

## 2017-03-31 NOTE — Telephone Encounter (Signed)
Authorization number placed in MRI order comments to [facility]. Order also printed and faxed to [facility] at [phone] requesting they contact the patient to schedule.

## 2017-03-31 NOTE — Telephone Encounter (Signed)
Breast MRI approved with authorization number [id number].  This is good for 90 days.

## 2017-04-17 NOTE — Telephone Encounter (Signed)
Patient is scheduled for recommended breast MRI on 04/20/17 with [facility]. Patient is aware of appointment information. Will forward to provider for review and close encounter.   cc: Dr Sabra Heck  cc: Reesa Chew, RN

## 2017-04-19 ENCOUNTER — Other Ambulatory Visit: Payer: Self-pay | Admitting: Orthopedic Surgery

## 2017-04-20 ENCOUNTER — Ambulatory Visit
Admission: RE | Admit: 2017-04-20 | Discharge: 2017-04-20 | Disposition: A | Discharge status: Home / Self Care | Payer: Managed Care, Other (non HMO) | Source: Ambulatory Visit | Attending: Obstetrics & Gynecology | Admitting: Obstetrics & Gynecology

## 2017-04-20 DIAGNOSIS — N6452 Nipple discharge: Secondary | ICD-10-CM

## 2017-04-20 MED ORDER — GADOBENATE DIMEGLUMINE 529 MG/ML IV SOLN
12.0000 mL | Freq: Once | INTRAVENOUS | Status: AC | PRN
  Administered 2017-04-20: 12 mL via INTRAVENOUS

## 2017-04-27 ENCOUNTER — Encounter (HOSPITAL_BASED_OUTPATIENT_CLINIC_OR_DEPARTMENT_OTHER): Payer: Self-pay | Admitting: *Deleted

## 2017-05-02 ENCOUNTER — Other Ambulatory Visit: Payer: Self-pay

## 2017-05-02 DIAGNOSIS — N6310 Unspecified lump in the right breast, unspecified quadrant: Secondary | ICD-10-CM

## 2017-05-03 ENCOUNTER — Other Ambulatory Visit: Payer: Self-pay | Admitting: Obstetrics & Gynecology

## 2017-05-03 ENCOUNTER — Ambulatory Visit
Admission: RE | Admit: 2017-05-03 | Discharge: 2017-05-03 | Disposition: A | Discharge status: Home / Self Care | Payer: Managed Care, Other (non HMO) | Source: Ambulatory Visit | Attending: Obstetrics & Gynecology | Admitting: Obstetrics & Gynecology

## 2017-05-03 DIAGNOSIS — N6310 Unspecified lump in the right breast, unspecified quadrant: Secondary | ICD-10-CM

## 2017-05-04 ENCOUNTER — Ambulatory Visit (HOSPITAL_BASED_OUTPATIENT_CLINIC_OR_DEPARTMENT_OTHER): Payer: Managed Care, Other (non HMO) | Admitting: Anesthesiology

## 2017-05-04 ENCOUNTER — Telehealth: Payer: Self-pay | Admitting: Obstetrics & Gynecology

## 2017-05-04 ENCOUNTER — Encounter (HOSPITAL_BASED_OUTPATIENT_CLINIC_OR_DEPARTMENT_OTHER)
Admission: RE | Disposition: A | Discharge status: Home / Self Care | Payer: Self-pay | Source: Ambulatory Visit | Attending: Orthopedic Surgery

## 2017-05-04 ENCOUNTER — Encounter (HOSPITAL_BASED_OUTPATIENT_CLINIC_OR_DEPARTMENT_OTHER): Payer: Self-pay | Admitting: Anesthesiology

## 2017-05-04 ENCOUNTER — Ambulatory Visit (HOSPITAL_BASED_OUTPATIENT_CLINIC_OR_DEPARTMENT_OTHER)
Admission: RE | Admit: 2017-05-04 | Discharge: 2017-05-04 | Disposition: A | Discharge status: Home / Self Care | Payer: Managed Care, Other (non HMO) | Source: Ambulatory Visit | Attending: Orthopedic Surgery | Admitting: Orthopedic Surgery

## 2017-05-04 DIAGNOSIS — S5332XA Traumatic rupture of left ulnar collateral ligament, initial encounter: Secondary | ICD-10-CM | POA: Diagnosis not present

## 2017-05-04 DIAGNOSIS — X58XXXA Exposure to other specified factors, initial encounter: Secondary | ICD-10-CM | POA: Insufficient documentation

## 2017-05-04 DIAGNOSIS — Z79899 Other long term (current) drug therapy: Secondary | ICD-10-CM | POA: Insufficient documentation

## 2017-05-04 DIAGNOSIS — S83422A Sprain of lateral collateral ligament of left knee, initial encounter: Secondary | ICD-10-CM | POA: Diagnosis present

## 2017-05-04 HISTORY — PX: MEDIAL COLLATERAL LIGAMENT REPAIR, KNEE: SHX2019

## 2017-05-04 SURGERY — RECONSTRUCTION, LIGAMENT, MEDIAL COLLATERAL, KNEE
Anesthesia: General | Site: Thumb | Laterality: Left

## 2017-05-04 MED ORDER — CEFAZOLIN SODIUM-DEXTROSE 2-4 GM/100ML-% IV SOLN
2.0000 g | INTRAVENOUS | Status: AC
  Administered 2017-05-04: 2 g via INTRAVENOUS

## 2017-05-04 MED ORDER — HYDROCODONE-ACETAMINOPHEN 5-325 MG PO TABS: ORAL_TABLET | ORAL | 0 refills | Status: DC

## 2017-05-04 MED ORDER — LACTATED RINGERS IV SOLN
INTRAVENOUS | Status: DC
  Administered 2017-05-04 (×2): via INTRAVENOUS

## 2017-05-04 MED ORDER — PROPOFOL 10 MG/ML IV BOLUS
INTRAVENOUS | Status: DC | PRN
Start: 2017-05-04 — End: 2017-05-04
  Administered 2017-05-04: 200 mg via INTRAVENOUS

## 2017-05-04 MED ORDER — FENTANYL CITRATE (PF) 100 MCG/2ML IJ SOLN
INTRAMUSCULAR | Status: AC
  Filled 2017-05-04: qty 2

## 2017-05-04 MED ORDER — CEFAZOLIN SODIUM-DEXTROSE 2-4 GM/100ML-% IV SOLN
INTRAVENOUS | Status: AC
Start: 2017-05-04 — End: ?
  Filled 2017-05-04: qty 100

## 2017-05-04 MED ORDER — ONDANSETRON HCL 4 MG/2ML IJ SOLN
INTRAMUSCULAR | Status: DC | PRN
  Administered 2017-05-04: 4 mg via INTRAVENOUS

## 2017-05-04 MED ORDER — MEPERIDINE HCL 25 MG/ML IJ SOLN: 6.2500 mg | INTRAMUSCULAR | Status: DC | PRN

## 2017-05-04 MED ORDER — FENTANYL CITRATE (PF) 100 MCG/2ML IJ SOLN: 50.0000 ug | INTRAMUSCULAR | Status: DC | PRN

## 2017-05-04 MED ORDER — LIDOCAINE HCL (CARDIAC) 20 MG/ML IV SOLN
INTRAVENOUS | Status: DC | PRN
  Administered 2017-05-04: 30 mg via INTRAVENOUS

## 2017-05-04 MED ORDER — PROPOFOL 10 MG/ML IV BOLUS
INTRAVENOUS | Status: AC
  Filled 2017-05-04: qty 20

## 2017-05-04 MED ORDER — ONDANSETRON HCL 4 MG/2ML IJ SOLN
INTRAMUSCULAR | Status: AC
  Filled 2017-05-04: qty 2

## 2017-05-04 MED ORDER — DEXAMETHASONE SODIUM PHOSPHATE 10 MG/ML IJ SOLN
INTRAMUSCULAR | Status: AC
  Filled 2017-05-04: qty 1

## 2017-05-04 MED ORDER — ONDANSETRON HCL 4 MG/2ML IJ SOLN: 4.0000 mg | Freq: Once | INTRAMUSCULAR | Status: DC | PRN

## 2017-05-04 MED ORDER — SCOPOLAMINE 1 MG/3DAYS TD PT72: 1.0000 | MEDICATED_PATCH | Freq: Once | TRANSDERMAL | Status: DC | PRN

## 2017-05-04 MED ORDER — LIDOCAINE 2% (20 MG/ML) 5 ML SYRINGE
INTRAMUSCULAR | Status: AC
  Filled 2017-05-04: qty 5

## 2017-05-04 MED ORDER — FENTANYL CITRATE (PF) 100 MCG/2ML IJ SOLN
25.0000 ug | INTRAMUSCULAR | Status: DC | PRN
  Administered 2017-05-04: 25 ug via INTRAVENOUS

## 2017-05-04 MED ORDER — DEXAMETHASONE SODIUM PHOSPHATE 10 MG/ML IJ SOLN
INTRAMUSCULAR | Status: DC | PRN
  Administered 2017-05-04: 10 mg via INTRAVENOUS

## 2017-05-04 MED ORDER — MIDAZOLAM HCL 2 MG/2ML IJ SOLN
INTRAMUSCULAR | Status: AC
  Filled 2017-05-04: qty 2

## 2017-05-04 MED ORDER — MIDAZOLAM HCL 2 MG/2ML IJ SOLN
1.0000 mg | INTRAMUSCULAR | Status: DC | PRN
Start: 2017-05-04 — End: 2017-05-04

## 2017-05-04 MED ORDER — BUPIVACAINE HCL (PF) 0.25 % IJ SOLN
INTRAMUSCULAR | Status: DC | PRN
  Administered 2017-05-04: 5 mL

## 2017-05-04 MED ORDER — FENTANYL CITRATE (PF) 100 MCG/2ML IJ SOLN
INTRAMUSCULAR | Status: DC | PRN
  Administered 2017-05-04: 25 ug via INTRAVENOUS
  Administered 2017-05-04: 100 ug via INTRAVENOUS
  Administered 2017-05-04 (×3): 25 ug via INTRAVENOUS

## 2017-05-04 MED ORDER — FLUCONAZOLE 150 MG PO TABS: 150.0000 mg | ORAL_TABLET | Freq: Once | ORAL | 0 refills | Status: AC

## 2017-05-04 MED ORDER — MIDAZOLAM HCL 5 MG/5ML IJ SOLN
INTRAMUSCULAR | Status: DC | PRN
  Administered 2017-05-04: 2 mg via INTRAVENOUS

## 2017-05-04 MED ORDER — CHLORHEXIDINE GLUCONATE 4 % EX LIQD: 60.0000 mL | Freq: Once | CUTANEOUS | Status: DC

## 2017-05-04 MED ORDER — 0.9 % SODIUM CHLORIDE (POUR BTL) OPTIME
TOPICAL | Status: DC | PRN
  Administered 2017-05-04: 200 mL

## 2017-05-04 SURGICAL SUPPLY — 73 items
ANCH SUT 3-0 MN 1 LD NDL (Anchor) ×1 IMPLANT
ANCHOR JUGGERKNOT 1.0 3-0 NLD (Anchor) ×1 IMPLANT
APL SKNCLS STERI-STRIP NONHPOA (GAUZE/BANDAGES/DRESSINGS) ×1
BANDAGE ACE 3X5.8 VEL STRL LF (GAUZE/BANDAGES/DRESSINGS) IMPLANT
BENZOIN TINCTURE PRP APPL 2/3 (GAUZE/BANDAGES/DRESSINGS) ×1 IMPLANT
BLADE MINI RND TIP GREEN BEAV (BLADE) ×2 IMPLANT
BLADE SURG 15 STRL LF DISP TIS (BLADE) ×2 IMPLANT
BLADE SURG 15 STRL SS (BLADE) ×4
BNDG CMPR 9X4 STRL LF SNTH (GAUZE/BANDAGES/DRESSINGS) ×1
BNDG ELASTIC 2X5.8 VLCR STR LF (GAUZE/BANDAGES/DRESSINGS) IMPLANT
BNDG ESMARK 4X9 LF (GAUZE/BANDAGES/DRESSINGS) ×2 IMPLANT
BNDG GAUZE ELAST 4 BULKY (GAUZE/BANDAGES/DRESSINGS) ×2 IMPLANT
CATH ROBINSON RED A/P 8FR (CATHETERS) IMPLANT
CHLORAPREP W/TINT 26ML (MISCELLANEOUS) ×2 IMPLANT
CORD BIPOLAR FORCEPS 12FT (ELECTRODE) ×2 IMPLANT
COVER BACK TABLE 60X90IN (DRAPES) ×2 IMPLANT
COVER MAYO STAND STRL (DRAPES) ×2 IMPLANT
CUFF TOURNIQUET SINGLE 18IN (TOURNIQUET CUFF) ×2 IMPLANT
DECANTER SPIKE VIAL GLASS SM (MISCELLANEOUS) IMPLANT
DRAPE EXTREMITY T 121X128X90 (DRAPE) ×2 IMPLANT
DRAPE OEC MINIVIEW 54X84 (DRAPES) IMPLANT
DRAPE SURG 17X23 STRL (DRAPES) ×2 IMPLANT
DRSG PAD ABDOMINAL 8X10 ST (GAUZE/BANDAGES/DRESSINGS) IMPLANT
GAUZE SPONGE 4X4 12PLY STRL (GAUZE/BANDAGES/DRESSINGS) ×2 IMPLANT
GAUZE XEROFORM 1X8 LF (GAUZE/BANDAGES/DRESSINGS) ×2 IMPLANT
GLOVE BIO SURGEON STRL SZ7.5 (GLOVE) ×2 IMPLANT
GLOVE BIOGEL PI IND STRL 6.5 (GLOVE) IMPLANT
GLOVE BIOGEL PI IND STRL 7.0 (GLOVE) IMPLANT
GLOVE BIOGEL PI IND STRL 8 (GLOVE) ×1 IMPLANT
GLOVE BIOGEL PI INDICATOR 6.5 (GLOVE) ×1
GLOVE BIOGEL PI INDICATOR 7.0 (GLOVE) ×2
GLOVE BIOGEL PI INDICATOR 8 (GLOVE) ×1
GLOVE SURG ORTHO 8.0 STRL STRW (GLOVE) ×1 IMPLANT
GOWN STRL REUS W/ TWL LRG LVL3 (GOWN DISPOSABLE) ×1 IMPLANT
GOWN STRL REUS W/TWL LRG LVL3 (GOWN DISPOSABLE) ×2
GOWN STRL REUS W/TWL XL LVL3 (GOWN DISPOSABLE) ×3 IMPLANT
K-WIRE .035X4 (WIRE) IMPLANT
NDL HYPO 25X1 1.5 SAFETY (NEEDLE) IMPLANT
NDL KEITH (NEEDLE) IMPLANT
NDL SAFETY ECLIPSE 18X1.5 (NEEDLE) IMPLANT
NEEDLE HYPO 18GX1.5 SHARP (NEEDLE)
NEEDLE HYPO 25X1 1.5 SAFETY (NEEDLE) ×2 IMPLANT
NEEDLE KEITH (NEEDLE) IMPLANT
NS IRRIG 1000ML POUR BTL (IV SOLUTION) ×2 IMPLANT
PACK BASIN DAY SURGERY FS (CUSTOM PROCEDURE TRAY) ×2 IMPLANT
PAD CAST 3X4 CTTN HI CHSV (CAST SUPPLIES) ×1 IMPLANT
PAD CAST 4YDX4 CTTN HI CHSV (CAST SUPPLIES) IMPLANT
PADDING CAST ABS 4INX4YD NS (CAST SUPPLIES) ×1
PADDING CAST ABS COTTON 4X4 ST (CAST SUPPLIES) ×1 IMPLANT
PADDING CAST COTTON 3X4 STRL (CAST SUPPLIES) ×2
PADDING CAST COTTON 4X4 STRL (CAST SUPPLIES)
PASSER SUT SWANSON 36MM LOOP (INSTRUMENTS) IMPLANT
SLEEVE SCD COMPRESS KNEE MED (MISCELLANEOUS) ×1 IMPLANT
SPLINT PLASTER CAST XFAST 3X15 (CAST SUPPLIES) ×1 IMPLANT
SPLINT PLASTER XTRA FASTSET 3X (CAST SUPPLIES) ×1
STOCKINETTE 4X48 STRL (DRAPES) ×2 IMPLANT
STRIP CLOSURE SKIN 1/4X4 (GAUZE/BANDAGES/DRESSINGS) ×1 IMPLANT
SUT CHROMIC 4 0 P 3 18 (SUTURE) ×1 IMPLANT
SUT ETHIBOND 3-0 V-5 (SUTURE) IMPLANT
SUT ETHILON 3 0 PS 1 (SUTURE) IMPLANT
SUT ETHILON 4 0 PS 2 18 (SUTURE) ×1 IMPLANT
SUT FIBERWIRE 2-0 18 17.9 3/8 (SUTURE)
SUT MERSILENE 2.0 SH NDLE (SUTURE) IMPLANT
SUT MERSILENE 4 0 P 3 (SUTURE) ×1 IMPLANT
SUT MON AB 5-0 P3 18 (SUTURE) ×1 IMPLANT
SUT SILK 4 0 PS 2 (SUTURE) IMPLANT
SUT VIC AB 0 SH 27 (SUTURE) IMPLANT
SUT VICRYL 4-0 PS2 18IN ABS (SUTURE) IMPLANT
SUTURE FIBERWR 2-0 18 17.9 3/8 (SUTURE) IMPLANT
SYR BULB 3OZ (MISCELLANEOUS) ×2 IMPLANT
SYR CONTROL 10ML LL (SYRINGE) ×1 IMPLANT
TOWEL OR 17X24 6PK STRL BLUE (TOWEL DISPOSABLE) ×4 IMPLANT
UNDERPAD 30X30 (UNDERPADS AND DIAPERS) ×2 IMPLANT

## 2017-05-04 NOTE — Op Note (Signed)
[id number] 

## 2017-05-04 NOTE — Op Note (Signed)
[name], [name]        ACCOUNT NO.:  [id number]  MEDICAL RECORD NO.:  [id number]  LOCATION:                                 FACILITY:  PHYSICIAN:  Leanora Cover, MD             DATE OF BIRTH:  DATE OF PROCEDURE:  05/04/2017 DATE OF DISCHARGE:                              OPERATIVE REPORT   PREOPERATIVE DIAGNOSIS:  Left thumb ulnar collateral ligament tear.  POSTOPERATIVE DIAGNOSIS:  Left thumb ulnar collateral ligament tear.  PROCEDURE:  Left thumb ulnar collateral ligament repair.  SURGEON:  Leanora Cover, M.D.  ASSISTANT:  Daryll Brod, M.D.  ANESTHESIA:  General.  IV FLUIDS:  Per anesthesia flow sheet.  ESTIMATED BLOOD LOSS:  Minimal.  COMPLICATIONS:  None.  SPECIMENS:  None.  TOURNIQUET TIME:  37 minutes.  DISPOSITION:  Stable to PACU.  INDICATIONS:  [name] is a 46 year old female, who sustained an injury to the left thumb collateral ligament.  This was confirmed on MRI.  She has laxity to stressing of the ulnar collateral ligament of the left thumb MP joint.  She wished to undergo operative repair. Risks, benefits, and alternatives of surgery were discussed including the risk of blood loss; infection; damage to nerves, vessels, tendons, ligaments, bone; failure of surgery; need for additional surgery; complications with wound healing; continued pain; and continued laxity. She voiced understanding of these risks and elected to proceed.  DESCRIPTION OF PROCEDURE:  After being identified preoperatively by myself, the patient agreed upon procedure and site of procedure. Surgical site was marked.  The risks, benefits, and alternatives of surgery were reviewed and she wished to proceed.  Surgical consent had been signed.  She was given IV Ancef as preoperative antibiotic prophylaxis.  She was transferred to the operating room and placed on the operating room table in supine position with the left upper extremity on arm board.  General anesthesia was  induced by anesthesiologist.  The left upper extremity was prepped and draped in normal sterile orthopedic fashion.  Surgical pause was performed between surgeons, anesthesia, and operating room staff; and all were in agreement as to the patient procedure and site of procedure.  Tourniquet to proximal aspect of the extremity was inflated to 250 mmHg after exsanguination of the limb with an Esmarch bandage.  Incision was made at the ulnar side of the MP joint of the thumb and carried into subcutaneous tissues by spreading technique.  Bipolar electrocautery was used to obtain hemostasis.  A branch of the dorsal sensory nerve was identified and protected throughout the case.  Aponeurosis was identified and sharply incised with the [facility] blade.  The capsule was incised and the ulnar collateral ligament identified.  It was easily freed from the scarring at the proximal phalanx.  The scar was removed with the scissors.  The insertion point of the ligament was identified and roughened up with the curette.  A JuggerKnot suture anchor was then used.  The drill was used to create the anchor hole.  JuggerKnot suture was placed and the ligament reapproximated to the proximal phalanx. This provided good stabilization of the ulnar collateral ligament and reduction of the volar subluxation.  The ligament was then oversewn  with the tail of the suture.  The capsule was then repaired with a 4-0 chromic suture in a running fashion.  The adductor aponeurosis was repaired with a running 4-0 Mersilene suture.  The skin was closed with a running subcuticular 5-0 Monocryl suture, which was augmented with benzoin and Steri-Strips.  The wound was injected with 5 mL of 0.25% plain Marcaine to aid in postoperative analgesia.  It was then dressed with sterile 4x4s and wrapped with a Kerlix bandage.  A thumb spica splint was placed and wrapped with Kerlix and Ace bandage.  Tourniquet was deflated at 37 minutes.   Fingertips were pink with brisk capillary refill after deflation of tourniquet.  Operative drapes were broken down.  The patient was awoken from anesthesia safely.  She was transferred back to the stretcher and taken to PACU in stable condition. I will see her back in the office in 1 week for postoperative followup. I will give her Norco 5/325 one to two p.o. q.6 hours p.r.n. pain dispensed #20.     Leanora Cover, MD     KK/MEDQ  D:  05/04/2017  T:  05/04/2017  Job:  [id number]

## 2017-05-04 NOTE — Op Note (Signed)
I assisted Surgeon(s) and Role:    Leanora Cover, MD - Primary on the Procedure(s): LEFT THUMB  [place] on 05/04/2017.  I provided assistance on this case as follows: setup, approach, exploration, repair, closure and application of the dressings and splints. I was present for the entire case.  Electronically signed by: Wynonia Sours, MD Date: 05/04/2017 Time: 2:25 PM

## 2017-05-04 NOTE — Anesthesia Preprocedure Evaluation (Addendum)
Anesthesia Evaluation  Patient identified by MRN, date of birth, ID band Patient awake    Reviewed: Allergy & Precautions, NPO status , Patient's Chart, lab work & pertinent test results  Airway Mallampati: II  TM Distance: >3 FB Neck ROM: Full    Dental no notable dental hx.    Pulmonary neg pulmonary ROS,    Pulmonary exam normal breath sounds clear to auscultation       Cardiovascular negative cardio ROS Normal cardiovascular exam Rhythm:Regular Rate:Normal     Neuro/Psych negative neurological ROS  negative psych ROS   GI/Hepatic negative GI ROS, Neg liver ROS,   Endo/Other  negative endocrine ROS  Renal/GU negative Renal ROS  negative genitourinary   Musculoskeletal negative musculoskeletal ROS (+) Arthritis , Rheumatoid disorders,    Abdominal   Peds negative pediatric ROS (+)  Hematology negative hematology ROS (+)   Anesthesia Other Findings   Reproductive/Obstetrics negative OB ROS                             Anesthesia Physical Anesthesia Plan  ASA: II  Anesthesia Plan: General   Post-op Pain Management:    Induction: Intravenous  PONV Risk Score and Plan: 2 and Ondansetron, Dexamethasone, Treatment may vary due to age or medical condition and Midazolam  Airway Management Planned: LMA  Additional Equipment:   Intra-op Plan:   Post-operative Plan:   Informed Consent:   Plan Discussed with: CRNA and Surgeon  Anesthesia Plan Comments: ( )       Anesthesia Quick Evaluation

## 2017-05-04 NOTE — Telephone Encounter (Signed)
Patient says she is having symptoms of another yeast infection and would like a prescription. An appointment was offered but patient declined.

## 2017-05-04 NOTE — Anesthesia Postprocedure Evaluation (Signed)
Anesthesia Post Note  Patient: Rose [name]  Procedure(s) Performed: LEFT THUMB  COLLATERAL LIGAMENT REPAIR (Left Thumb)     Patient location during evaluation: PACU Anesthesia Type: General Level of consciousness: awake and alert Pain management: pain level controlled Vital Signs Assessment: post-procedure vital signs reviewed and stable Respiratory status: spontaneous breathing, nonlabored ventilation, respiratory function stable and patient connected to nasal cannula oxygen Cardiovascular status: blood pressure returned to baseline and stable Postop Assessment: no apparent nausea or vomiting Anesthetic complications: no    Last Vitals:  Vitals:   05/04/17 1252 05/04/17 1430  BP: 136/88 (!) 157/99  Pulse: 99 99  Resp: 16 12  Temp: 36.8 C 36.5 C  SpO2: 100% 100%    Last Pain:  Vitals:   05/04/17 1252  TempSrc: Oral                 Leonardo Plaia

## 2017-05-04 NOTE — Telephone Encounter (Signed)
Spoke with patient. Patient requesting RX for Diflucan.   Reports white, cottage cheese vaginal d/c without odor and vaginal irritation. Started on 05/01/17.   Patient declined OV states she is having surgery later today on her thumb and a breast biopsy on Monday.   Advised patient will review with Dr. Sabra Heck and return call. Patient request detailed message be left on voicemail. Verified pharmacy on file.   Dr. Sabra Heck -please advise on RX for diflucan?

## 2017-05-04 NOTE — Brief Op Note (Signed)
05/04/2017  2:24 PM  PATIENT:  Rose [name]  46 y.o. female  PRE-OPERATIVE DIAGNOSIS:  Left Thumb Collateral Ligament Tear  [phone]  POST-OPERATIVE DIAGNOSIS:  Left Thumb Collateral Ligament Tear  E08.144Y  PROCEDURE:  Procedure(s): LEFT THUMB  COLLATERAL LIGAMENT REPAIR (Left)  SURGEON:  Surgeon(s) and Role:    Leanora Cover, MD - Primary  PHYSICIAN ASSISTANT:   ASSISTANTS: Daryll Brod, MD   ANESTHESIA:   general  EBL:  minimal  BLOOD ADMINISTERED:none  DRAINS: none   LOCAL MEDICATIONS USED:  MARCAINE     SPECIMEN:  No Specimen  DISPOSITION OF SPECIMEN:  N/A  COUNTS:  YES  TOURNIQUET:   Total Tourniquet Time Documented: Upper Arm (Left) - 37 minutes Total: Upper Arm (Left) - 37 minutes   DICTATION: .Other Dictation: Dictation Number [id number]  PLAN OF CARE: Discharge to home after PACU  PATIENT DISPOSITION:  PACU - hemodynamically stable.

## 2017-05-04 NOTE — Telephone Encounter (Signed)
Left detailed message, ok per patient request as seen below.  Advised RX for diflucan sent to [facility] pharmacy. Take one tablet now, repeat in 72 hours if symptoms have not resolved. Return call to office for any further questions.  Will close encounter.

## 2017-05-04 NOTE — Telephone Encounter (Signed)
Ok to give pt rx for diflucan 150mg  po x 1, repeat 72 hours if needed.  #2/0RF.

## 2017-05-04 NOTE — H&P (Signed)
Rose [name] is an 46 y.o. female.   Chief Complaint: left thumb collateral ligament tear HPI: 46 with left thumb collateral ligament tear.  There is laxity of the ligament to testing.  She wishes to undergo operative repair of the ligament.  Allergies: No Known Allergies  Past Medical History:  Diagnosis Date  . Abdominal pain   . Chronic rheumatic arthritis ([place])   . N&V (nausea and vomiting)   . Poor appetite     Past Surgical History:  Procedure Laterality Date  . APPENDECTOMY  4/12  . COLPOSCOPY  1999  . HYSTEROSCOPY WITH RESECTOSCOPE  11/10    Family History: Family History  Problem Relation Age of Onset  . Breast cancer Maternal Aunt     Social History:   reports that she has never smoked. She has never used smokeless tobacco. She reports that she drinks about 3.0 - 3.6 oz of alcohol per week . She reports that she does not use drugs.  Medications: Medications Prior to Admission  Medication Sig Dispense Refill  . Abatacept (ORENCIA) 125 MG/ML SOLN Inject into the skin. Every 4 weeks    . acetaminophen (TYLENOL) 500 MG tablet Take 650 mg by mouth every 6 (six) hours as needed.     . clindamycin-tretinoin (ZIANA) gel Apply topically daily.      . Dapsone (ACZONE) 5 % topical gel Apply topically 2 (two) times daily.    . fish oil-omega-3 fatty acids 1000 MG capsule Take 1,000 mg by mouth 2 (two) times daily.      . fluticasone (FLONASE) 50 MCG/ACT nasal spray USE 2 SPRAYS IN EACH NOSTRIL ONCE DAILY AS NEEDED 30 DAYS  6  . HYDROcodone-acetaminophen (NORCO/VICODIN) 5-325 MG tablet Take 1-2 tablets by mouth every 6 (six) hours as needed for moderate pain or severe pain. 30 tablet 0  . hydroxychloroquine (PLAQUENIL) 200 MG tablet Take by mouth 2 (two) times daily.      . methocarbamol (ROBAXIN) 500 MG tablet Take 1 tablet (500 mg total) by mouth at bedtime as needed for muscle spasms. 12 tablet 0  . Multiple Vitamin (ONCE DAILY PO) Take by mouth 1 dose over 24  hours.      . norethindrone (CAMILA) 0.35 MG tablet Take 1 tablet (0.35 mg total) by mouth daily. 84 tablet 4  . Prenatal Vit-Fe Fumarate-FA (PNV PRENATAL PLUS MULTIVITAMIN) 27-1 MG TABS TAKE 1 TABLET EVERY DAY 30 tablet 11  . naproxen (NAPROSYN) 500 MG tablet Take 1 tablet (500 mg total) by mouth 2 (two) times daily with a meal. 30 tablet 0  . valACYclovir (VALTREX) 1000 MG tablet TAKE 1/2 TABLET BY MOUTH DAILY 30 tablet 2    No results found for this or any previous visit (from the past 48 hour(s)).  US Breast Ltd Uni Right Inc Axilla  Result Date: 05/03/2017 CLINICAL DATA:  45 year old female presenting for second-look ultrasound from a breast MRI from 04/20/2017. EXAM: ULTRASOUND OF THE RIGHT BREAST COMPARISON:  Previous exam(s). FINDINGS: On physical exam, no discrete palpable masses are identified in the lateral aspect of the right breast. Targeted ultrasound is performed, showing normal extremely dense fibroglandular tissue. No definite suspicious masses are identified. IMPRESSION: No correlate is identified on today's ultrasound to account for the mass identified on MRI in the lateral right breast. RECOMMENDATION: MRI guided biopsy is recommended for 2 of the lesions described in the MRI report. Her MRI biopsy has been scheduled for 05/08/2017 at 7:30 a.m. I have discussed the findings  and recommendations with the patient. Results were also provided in writing at the conclusion of the visit. If applicable, a reminder letter will be sent to the patient regarding the next appointment. BI-RADS CATEGORY  1: Negative. Electronically Signed   By: Ammie Ferrier M.D.   On: 05/03/2017 09:54     A comprehensive review of systems was negative.  Blood pressure 136/88, pulse 99, temperature 98.2 F (36.8 C), temperature source Oral, resp. rate 16, height 5' (1.524 m), weight 62.6 kg (138 lb), last menstrual period 04/14/2017, SpO2 100 %.  General appearance: alert, cooperative and appears stated  age Head: Normocephalic, without obvious abnormality, atraumatic Neck: supple, symmetrical, trachea midline Resp: clear to auscultation bilaterally Cardio: regular rate and rhythm GI: non-tender Extremities: Intact sensation and capillary refill all digits.  +epl/fpl/io.  No wounds.  Pulses: 2+ and symmetric Skin: Skin color, texture, turgor normal. No rashes or lesions Neurologic: Grossly normal Incision/Wound:none  Assessment/Plan Left thumb collateral ligament tear.  Non operative and operative treatment options were discussed with the patient and patient wishes to proceed with operative treatment. She wishes to have operative repair.  Risks, benefits, and alternatives of surgery were discussed and the patient agrees with the plan of care.   Laith Antonelli R 05/04/2017, 1:04 PM

## 2017-05-04 NOTE — Telephone Encounter (Signed)
Left message to call Rose [name] at [phone].  

## 2017-05-04 NOTE — Anesthesia Procedure Notes (Signed)
Procedure Name: LMA Insertion Date/Time: 05/04/2017 1:30 PM Performed by: Toula Moos L Pre-anesthesia Checklist: Patient identified, Emergency Drugs available, Suction available, Patient being monitored and Timeout performed Patient Re-evaluated:Patient Re-evaluated prior to induction Oxygen Delivery Method: Circle system utilized Preoxygenation: Pre-oxygenation with 100% oxygen Induction Type: IV induction Ventilation: Mask ventilation without difficulty LMA: LMA inserted LMA Size: 4.0 Number of attempts: 1 Airway Equipment and Method: Bite block Placement Confirmation: positive ETCO2 Tube secured with: Tape Dental Injury: Teeth and Oropharynx as per pre-operative assessment

## 2017-05-04 NOTE — Transfer of Care (Signed)
Immediate Anesthesia Transfer of Care Note  Patient: Rose [name]  Procedure(s) Performed: LEFT THUMB  COLLATERAL LIGAMENT REPAIR (Left Thumb)  Patient Location: PACU  Anesthesia Type:General  Level of Consciousness: awake and patient cooperative  Airway & Oxygen Therapy: Patient Spontanous Breathing and Patient connected to face mask oxygen  Post-op Assessment: Report given to RN and Post -op Vital signs reviewed and stable  Post vital signs: Reviewed and stable  Last Vitals:  Vitals:   05/04/17 1252  BP: 136/88  Pulse: 99  Resp: 16  Temp: 36.8 C  SpO2: 100%    Last Pain:  Vitals:   05/04/17 1252  TempSrc: Oral         Complications: No apparent anesthesia complications

## 2017-05-04 NOTE — Discharge Instructions (Addendum)

## 2017-05-05 ENCOUNTER — Encounter (HOSPITAL_BASED_OUTPATIENT_CLINIC_OR_DEPARTMENT_OTHER): Payer: Self-pay | Admitting: Orthopedic Surgery

## 2017-05-08 ENCOUNTER — Ambulatory Visit
Admission: RE | Admit: 2017-05-08 | Discharge: 2017-05-08 | Disposition: A | Discharge status: Home / Self Care | Payer: Managed Care, Other (non HMO) | Source: Ambulatory Visit | Attending: Obstetrics & Gynecology | Admitting: Obstetrics & Gynecology

## 2017-05-08 DIAGNOSIS — N6310 Unspecified lump in the right breast, unspecified quadrant: Secondary | ICD-10-CM

## 2017-05-08 MED ORDER — GADOBENATE DIMEGLUMINE 529 MG/ML IV SOLN
12.0000 mL | Freq: Once | INTRAVENOUS | Status: AC | PRN
  Administered 2017-05-08: 12 mL via INTRAVENOUS

## 2017-05-10 ENCOUNTER — Other Ambulatory Visit: Payer: Self-pay | Admitting: Obstetrics & Gynecology

## 2017-05-10 DIAGNOSIS — N63 Unspecified lump in unspecified breast: Secondary | ICD-10-CM

## 2017-05-10 DIAGNOSIS — N6452 Nipple discharge: Secondary | ICD-10-CM

## 2017-05-17 ENCOUNTER — Ambulatory Visit
Admission: RE | Admit: 2017-05-17 | Discharge: 2017-05-17 | Disposition: A | Discharge status: Home / Self Care | Payer: Managed Care, Other (non HMO) | Source: Ambulatory Visit | Attending: Obstetrics & Gynecology | Admitting: Obstetrics & Gynecology

## 2017-05-17 DIAGNOSIS — N6452 Nipple discharge: Secondary | ICD-10-CM

## 2017-05-17 DIAGNOSIS — N63 Unspecified lump in unspecified breast: Secondary | ICD-10-CM

## 2017-05-17 DIAGNOSIS — C50919 Malignant neoplasm of unspecified site of unspecified female breast: Secondary | ICD-10-CM

## 2017-05-17 HISTORY — DX: Malignant neoplasm of unspecified site of unspecified female breast: C50.919

## 2017-05-17 MED ORDER — GADOBENATE DIMEGLUMINE 529 MG/ML IV SOLN
12.0000 mL | Freq: Once | INTRAVENOUS | Status: AC | PRN
  Administered 2017-05-17: 12 mL via INTRAVENOUS

## 2017-05-22 ENCOUNTER — Ambulatory Visit: Payer: Self-pay | Admitting: Surgery

## 2017-05-22 DIAGNOSIS — D0511 Intraductal carcinoma in situ of right breast: Secondary | ICD-10-CM

## 2017-05-22 NOTE — H&P (Signed)
Rose [name] 05/22/2017 2:56 PM Location: [facility] Patient #: [id number] DOB: July 31, 1970 Married / Language: English / Race: Black or African American Female  History of Present Illness Rose Moores A. 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 She underwent biopsy of other suspicious areas were which were benign. She is ready for surgery and was to proceed with right breast seed localized lumpectomy.     ADDITIONAL INFORMATION: 1. PROGNOSTIC INDICATORS Results: IMMUNOHISTOCHEMICAL AND MORPHOMETRIC ANALYSIS PERFORMED MANUALLY Estrogen Receptor: 95%, POSITIVE, STRONG STAINING INTENSITY Progesterone Receptor: 90%, POSITIVE, STRONG STAINING INTENSITY REFERENCE RANGE ESTROGEN RECEPTOR NEGATIVE 0% POSITIVE =>1% REFERENCE RANGE PROGESTERONE RECEPTOR NEGATIVE 0% POSITIVE =>1% All controls stained appropriately Rose Cutter MD Pathologist, Electronic Signature ( Signed 05/16/2017) FINAL DIAGNOSIS Diagnosis 1. Breast, right, needle core biopsy, 8:30 o'clock, cylinder marker - DUCTAL CARCINOMA IN SITU WITH CALCIFICATIONS. - SEE COMMENT. 2. Breast, right, needle core biopsy, 10:00 o'clock, dumbbell marker - PSEUDOANGIOMATOUS STROMAL HYPERPLASIA. - FIBROCYSTIC CHANGES WITH ADENOSIS AND CALCIFICATIONS. - THERE IS NO EVIDENCE OF MALIGNANCY. 1 of 3 FINAL for [name], Rose E [phone]) Microscopic Comment 1. Immunohistochemical stains for calponin, smooth muscle myosin, and p63 high            CLINICAL DATA: 46 year old female presenting for MRI given history of spontaneous right nipple discharge from 1 duct which occurred for 1 week in early September. She also had a recent biopsy on March 20, 2017 with pathology results demonstrating fibrocystic change of the right breast.  LABS: Not applicable.  EXAM: BILATERAL BREAST MRI WITH AND WITHOUT  CONTRAST  TECHNIQUE: Multiplanar, multisequence MR images of both breasts were obtained prior to and following the intravenous administration of 12 ml of MultiHance.  THREE-DIMENSIONAL MR IMAGE RENDERING ON INDEPENDENT WORKSTATION:  Three-dimensional MR images were rendered by post-processing of the original MR data on an independent workstation. The three-dimensional MR images were interpreted, and findings are reported in the following complete MRI report for this study. Three dimensional images were evaluated at the independent DynaCad workstation  COMPARISON: No prior MRI available for comparison. Correlation made with prior mammograms and ultrasounds.  FINDINGS: Breast composition: d. Extreme fibroglandular tissue.  Background parenchymal enhancement: Marked  Right breast:  In the superficial lateral aspect of the right breast in the middle to posterior depth there is a suspicious 1.3 x 0.8 x 0.9 cm spiculated mass (series 7, image 130).  In addition to this, there are several 4-5 mm enhancing foci with demonstrate washout kinetics. The largest an most intensely enhancing of these (most suspicious) is in the upper slightly outer aspect of the right breast, middle depth (series 7, image 114), and measures 5 mm. Two others are seen an series 7, image 140 in the lower outer quadrant posterior depth, and in image 100 in the upper-outer quadrant middle to anterior depth). There are however multiple bilateral enhancing foci which are less than 5 mm decreasing sensitivity of the MRI.  The biopsy site from the benign biopsy is seen in the lower-inner quadrant of the right breast associated with susceptibility from the biopsy marking clip.  Left breast: No mass or abnormal enhancement.  Lymph nodes: No abnormal appearing lymph nodes.  Ancillary findings: None.  IMPRESSION: 1. Marked background parenchymal enhancement with multiple bilateral enhancing foci limits  sensitivity of this MRI.  2. There is a suspicious spiculated 1.3 cm mass in the lateral right breast, middle depth.  3. There are a few enhancing foci in the  lateral aspect of the right breast with washout kinetics. The most suspicious of these is in the slightly upper outer right breast and measures 5 mm.  4. No MRI evidence of malignancy in the left breast.  RECOMMENDATION: 1. Second-look ultrasound is recommended for the spiculated right breast mass, and for satellite lesions in the lateral right breast. If either is not seen by ultrasound, MRI biopsy is recommended. If MRI biopsy is done for one of the smaller lesions, the possible satellite lesion in the upper outer right breast (series 7, image 114), should be performed.  2. Right axillary ultrasound should be done at the time of the 2nd look to document lymph node status.  BI-RADS CATEGORY 5: Highly suggestive of malignancy.   Electronically Signed By: Rose [name] M.D. On: 04/21/2017 12:40                 Diagnosis Breast, right, needle core biopsy, upper central - FIBROCYSTIC CHANGE WITH CALCIFICATIONS - USUAL DUCTAL HYPERPLASIA. - NO MALIGNANCY IDENTIFIED. Microscopic Comment The case was called to [facility] on 05/18/2017. Rose Males MD Pathologist, Electronic Signature (Case signed 05/18/2017) Specimen.  The patient is a 46 year old female.   Allergies (Rose [name], [place]; 05/22/2017 2:56 PM) No Known Drug Allergies 05/15/2017 Allergies Reconciled  Medication History (Rose [name], [place]; 05/22/2017 2:56 PM) Rose [name] (125MG /ML Solution, Subcutaneous) Active. Tylenol 8 Hour (650MG  Tablet ER, Oral) Active. Aczone (5% Gel, External) Active. Fish Oil-Vitamin D (1000-1000MG -UNIT Capsule, Oral) Active. Prenatal (Oral) Active. Multiple Vitamin (Oral) Active. Medications Reconciled    Vitals (Rose A. Brown RMA; 05/22/2017 2:56 PM) 05/22/2017  2:56 PM Weight: 139.8 lb Height: 60in Body Surface Area: 1.6 m Body Mass Index: 27.3 kg/m  Temp.: 98.61F  Pulse: 105 (Regular)  BP: 136/82 (Sitting, Left Arm, Standard)      Physical Exam (Rose Huguley A. Blessin Kanno MD; 05/22/2017 3:34 PM)  General Mental Status-Alert. General Appearance-Consistent with stated age. Hydration-Well hydrated. Voice-Normal.  Breast Note: not re EXAMINED TODAY  Neurologic Neurologic evaluation reveals -alert and oriented x 3 with no impairment of recent or remote memory. Mental Status-Normal.  Musculoskeletal Normal Exam - Left-Upper Extremity Strength Normal and Lower Extremity Strength Normal. Normal Exam - Right-Upper Extremity Strength Normal and Lower Extremity Strength Normal.    Assessment & Plan (Keelynn Furgerson A. Nyjai Graff MD; 05/22/2017 3:38 PM)  BREAST NEOPLASM, TIS (DCIS), RIGHT (D05.11) Impression: PT HAS OPTED FOR RIGHT BREAST LUMPECTOMY  NEEDS TO SEE ONCOLOGY AND GENETICS NEEDS TO SEE RADIATION ONCOLOGY   Risk of lumpectomy include bleeding, infection, seroma, more surgery, use of seed/wire, wound care, cosmetic deformity and the need for other treatments, death , blood clots, death. Pt agrees to proceed.  Current Plans We discussed the staging and pathophysiology of breast cancer. We discussed all of the different options for treatment for breast cancer including surgery, chemotherapy, radiation therapy, Herceptin, and antiestrogen therapy. We discussed a sentinel lymph node biopsy as she does not appear to having lymph node involvement right now. We discussed the performance of that with injection of radioactive tracer and blue dye. We discussed that she would have an incision underneath her axillary hairline. We discussed that there is a bout a 10-20% chance of having a positive node with a sentinel lymph node biopsy and we will await the permanent pathology to make any other first further decisions in terms of  her treatment. One of these options might be to return to the operating room to perform an axillary lymph  node dissection. We discussed about a 1-2% risk lifetime of chronic shoulder pain as well as lymphedema associated with a sentinel lymph node biopsy. We discussed the options for treatment of the breast cancer which included lumpectomy versus a mastectomy. We discussed the performance of the lumpectomy with a wire placement. We discussed a 10-20% chance of a positive margin requiring reexcision in the operating room. We also discussed that she may need radiation therapy or antiestrogen therapy or both if she undergoes lumpectomy. We discussed the mastectomy and the postoperative care for that as well. We discussed that there is no difference in her survival whether she undergoes lumpectomy with radiation therapy or antiestrogen therapy versus a mastectomy. There is a slight difference in the local recurrence rate being 3-5% with lumpectomy and about 1% with a mastectomy. We discussed the risks of operation including bleeding, infection, possible reoperation. She understands her further therapy will be based on what her stages at the time of her operation.  Pt Education - flb breast cancer surgery: discussed with patient and provided information. Pt Education - ABC (After Breast Cancer) Class Info: discussed with patient and provided information.

## 2017-05-22 NOTE — H&P (Signed)
hristie E [name] 05/22/2017 2:56 PM Location: [facility] Patient #: [id number] DOB: 10/28/1970 Married / Language: English / Race: Black or African American Female  History of Present Illness [name] 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 She underwent biopsy of other suspicious areas were which were benign. She is ready for surgery and was to proceed with right breast seed localized lumpectomy.     ADDITIONAL INFORMATION: 1. PROGNOSTIC INDICATORS Results: IMMUNOHISTOCHEMICAL AND MORPHOMETRIC ANALYSIS PERFORMED MANUALLY Estrogen Receptor: 95%, POSITIVE, STRONG STAINING INTENSITY Progesterone Receptor: 90%, POSITIVE, STRONG STAINING INTENSITY REFERENCE RANGE ESTROGEN RECEPTOR NEGATIVE 0% POSITIVE =>1% REFERENCE RANGE PROGESTERONE RECEPTOR NEGATIVE 0% POSITIVE =>1% All controls stained appropriately [name] MD Pathologist, Electronic Signature ( Signed 05/16/2017) FINAL DIAGNOSIS Diagnosis 1. Breast, right, needle core biopsy, 8:30 o'clock, cylinder marker - DUCTAL CARCINOMA IN SITU WITH CALCIFICATIONS. - SEE COMMENT. 2. Breast, right, needle core biopsy, 10:00 o'clock, dumbbell marker - PSEUDOANGIOMATOUS STROMAL HYPERPLASIA. - FIBROCYSTIC CHANGES WITH ADENOSIS AND CALCIFICATIONS. - THERE IS NO EVIDENCE OF MALIGNANCY. 1 of 3 FINAL for [name], Rose E [phone]) Microscopic Comment 1. Immunohistochemical stains for calponin, smooth muscle myosin, and p63 high            CLINICAL DATA: 46 year old female presenting for MRI given history of spontaneous right nipple discharge from 1 duct which occurred for 1 week in early September. She also had a recent biopsy on March 20, 2017 with pathology results demonstrating fibrocystic change of the right breast.  LABS: Not applicable.  EXAM: BILATERAL BREAST MRI WITH AND WITHOUT CONTRAST  TECHNIQUE: Multiplanar,  multisequence MR images of both breasts were obtained prior to and following the intravenous administration of 12 ml of MultiHance.  THREE-DIMENSIONAL MR IMAGE RENDERING ON INDEPENDENT WORKSTATION:  Three-dimensional MR images were rendered by post-processing of the original MR data on an independent workstation. The three-dimensional MR images were interpreted, and findings are reported in the following complete MRI report for this study. Three dimensional images were evaluated at the independent DynaCad workstation  COMPARISON: No prior MRI available for comparison. Correlation made with prior mammograms and ultrasounds.  FINDINGS: Breast composition: d. Extreme fibroglandular tissue.  Background parenchymal enhancement: Marked  Right breast:  In the superficial lateral aspect of the right breast in the middle to posterior depth there is a suspicious 1.3 x 0.8 x 0.9 cm spiculated mass (series 7, image 130).  In addition to this, there are several 4-5 mm enhancing foci with demonstrate washout kinetics. The largest an most intensely enhancing of these (most suspicious) is in the upper slightly outer aspect of the right breast, middle depth (series 7, image 114), and measures 5 mm. Two others are seen an series 7, image 140 in the lower outer quadrant posterior depth, and in image 100 in the upper-outer quadrant middle to anterior depth). There are however multiple bilateral enhancing foci which are less than 5 mm decreasing sensitivity of the MRI.  The biopsy site from the benign biopsy is seen in the lower-inner quadrant of the right breast associated with susceptibility from the biopsy marking clip.  Left breast: No mass or abnormal enhancement.  Lymph nodes: No abnormal appearing lymph nodes.  Ancillary findings: None.  IMPRESSION: 1. Marked background parenchymal enhancement with multiple bilateral enhancing foci limits sensitivity of this MRI.  2. There is a  suspicious spiculated 1.3 cm mass in the lateral right breast, middle depth.  3. There are a few enhancing foci in the  lateral aspect of the right breast with washout kinetics. The most suspicious of these is in the slightly upper outer right breast and measures 5 mm.  4. No MRI evidence of malignancy in the left breast.  RECOMMENDATION: 1. Second-look ultrasound is recommended for the spiculated right breast mass, and for satellite lesions in the lateral right breast. If either is not seen by ultrasound, MRI biopsy is recommended. If MRI biopsy is done for one of the smaller lesions, the possible satellite lesion in the upper outer right breast (series 7, image 114), should be performed.  2. Right axillary ultrasound should be done at the time of the 2nd look to document lymph node status.  BI-RADS CATEGORY 5: Highly suggestive of malignancy.   Electronically Signed By: Ammie Ferrier M.D. On: 04/21/2017 12:40                 Diagnosis Breast, right, needle core biopsy, upper central - FIBROCYSTIC CHANGE WITH CALCIFICATIONS - USUAL DUCTAL HYPERPLASIA. - NO MALIGNANCY IDENTIFIED. Microscopic Comment The case was called to [facility] on 05/18/2017. Rose Males MD Pathologist, Electronic Signature (Case signed 05/18/2017) Specimen.  The patient is a 46 year old female.   Allergies ([name], [place]; 05/22/2017 2:56 PM) No Known Drug Allergies 05/15/2017 Allergies Reconciled  Medication History ([name], [place]; 05/22/2017 2:56 PM) [name] (125MG /ML Solution, Subcutaneous) Active. Tylenol 8 Hour (650MG  Tablet ER, Oral) Active. Aczone (5% Gel, External) Active. Fish Oil-Vitamin D (1000-1000MG -UNIT Capsule, Oral) Active. Prenatal (Oral) Active. Multiple Vitamin (Oral) Active. Medications Reconciled    Vitals ([name]; 05/22/2017 2:56 PM) 05/22/2017 2:56 PM Weight: 139.8 lb Height:  60in Body Surface Area: 1.6 m Body Mass Index: 27.3 kg/m  Temp.: 98.86F  Pulse: 105 (Regular)  BP: 136/82 (Sitting, Left Arm, Standard)      Physical Exam ([name] MD; 05/22/2017 3:34 PM)  General Mental Status-Alert. General Appearance-Consistent with stated age. Hydration-Well hydrated. Voice-Normal.  Breast Note: not re EXAMINED TODAY  Neurologic Neurologic evaluation reveals -alert and oriented x 3 with no impairment of recent or remote memory. Mental Status-Normal.  Musculoskeletal Normal Exam - Left-Upper Extremity Strength Normal and Lower Extremity Strength Normal. Normal Exam - Right-Upper Extremity Strength Normal and Lower Extremity Strength Normal.    Assessment & Plan (Shaney Deckman A. Esra Frankowski MD; 05/22/2017 3:38 PM)  BREAST NEOPLASM, TIS (DCIS), RIGHT (D05.11) Impression: PT HAS OPTED FOR RIGHT BREAST LUMPECTOMY  NEEDS TO SEE ONCOLOGY AND GENETICS NEEDS TO SEE RADIATION ONCOLOGY   Risk of lumpectomy include bleeding, infection, seroma, more surgery, use of seed/wire, wound care, cosmetic deformity and the need for other treatments, death , blood clots, death. Pt agrees to proceed.  Current Plans We discussed the staging and pathophysiology of breast cancer. We discussed all of the different options for treatment for breast cancer including surgery, chemotherapy, radiation therapy, Herceptin, and antiestrogen therapy. We discussed a sentinel lymph node biopsy as she does not appear to having lymph node involvement right now. We discussed the performance of that with injection of radioactive tracer and blue dye. We discussed that she would have an incision underneath her axillary hairline. We discussed that there is a bout a 10-20% chance of having a positive node with a sentinel lymph node biopsy and we will await the permanent pathology to make any other first further decisions in terms of her treatment. One of these options  might be to return to the operating room to perform an axillary lymph  node dissection. We discussed about a 1-2% risk lifetime of chronic shoulder pain as well as lymphedema associated with a sentinel lymph node biopsy. We discussed the options for treatment of the breast cancer which included lumpectomy versus a mastectomy. We discussed the performance of the lumpectomy with a wire placement. We discussed a 10-20% chance of a positive margin requiring reexcision in the operating room. We also discussed that she may need radiation therapy or antiestrogen therapy or both if she undergoes lumpectomy. We discussed the mastectomy and the postoperative care for that as well. We discussed that there is no difference in her survival whether she undergoes lumpectomy with radiation therapy or antiestrogen therapy versus a mastectomy. There is a slight difference in the local recurrence rate being 3-5% with lumpectomy and about 1% with a mastectomy. We discussed the risks of operation including bleeding, infection, possible reoperation. She understands her further therapy will be based on what her stages at the time of her operation.  Pt Education - flb breast cancer surgery: discussed with patient and provided information. Pt Education - ABC (After Breast Cancer) Class Info: discussed with patient and provided information.

## 2017-05-23 ENCOUNTER — Encounter: Payer: Self-pay | Admitting: Radiation Oncology

## 2017-05-23 ENCOUNTER — Telehealth: Payer: Self-pay | Admitting: Hematology and Oncology

## 2017-05-23 ENCOUNTER — Telehealth: Payer: Self-pay | Admitting: *Deleted

## 2017-05-23 NOTE — Telephone Encounter (Signed)
  Oncology Nurse Navigator Documentation  Navigator Location: [facility] (05/23/17 1100) Referral date to RadOnc/MedOnc: 05/22/17 (05/23/17 1100) )Navigator Encounter Type: Introductory phone call (05/23/17 1100)   Abnormal Finding Date: 05/03/17 (05/23/17 1100) Confirmed Diagnosis Date: 05/08/17 (05/23/17 1100)   Genetic Counseling Date: 05/29/17 (05/23/17 1100) Genetic Counseling Type: Urgent (05/23/17 1100)   Multidisiplinary Clinic Date: (None) (05/23/17 1100) Multidisiplinary Clinic Type: Breast (05/23/17 1100)       Barriers/Navigation Needs: Coordination of Care (05/23/17 1100)   Interventions: Coordination of Care (05/23/17 1100)            Acuity: Level 2 (05/23/17 1100)

## 2017-05-23 NOTE — Telephone Encounter (Signed)
Spoke to the pt and confirmed her appt with Dr. Lindi Adie on 11/15 at 230pm.

## 2017-05-23 NOTE — Progress Notes (Signed)
Location of Breast Cancer:Right Breast 10  0'clock , Upper outer   Histology per Pathology Report: Diagnosis11/9/18 Breast, right, needle core biopsy, upper central - FIBROCYSTIC CHANGE WITH CALCIFICATIONS - USUAL DUCTAL HYPERPLASIA. - NO MALIGNANCY IDENTIFIED. Diagnosis 05/08/17: 1. Breast, right, needle core biopsy, 8:30 o'clock, cylinder marker - DUCTAL CARCINOMA IN SITU WITH CALCIFICATIONS. - SEE COMMENT. 2. Breast, right, needle core biopsy, 10:00 o'clock, dumbbell marker - PSEUDOANGIOMATOUS STROMAL HYPERPLASIA. - FIBROCYSTIC CHANGES WITH ADENOSIS AND CALCIFICATIONS. - THERE IS NO EVIDENCE OF MALIGNANCY. Diagnosis 03/20/17 Breast, right, needle core biopsy, 4:00 o'clock, 2cmfn - FIBROCYSTIC CHANGES. - THERE IS NO EVIDENCE OF MALIGNANCY  Receptor Status: ER(95%+), PR (90%+), Her2-neu (), Ki-()  Did patient present with symptoms (if so, please note symptoms) or was this found on screening mammography?:   Past/Anticipated interventions by surgeon, if any: Dr. Brantley Stage, MD   Past/Anticipated interventions by medical oncology, if any: Chemotherapy Dr. Gudena,05/25/17 new appt Lymphedema issues, if any:   Pain issues, if any: right tenderness breast  SAFETY ISSUES: NO  Prior radiation?  No  Pacemaker/ICD?  Possible current pregnancy? No  Is the patient on methotrexate? No  Current Complaints / other details:  Married, non smoker, no smokeless tobacco ,Moderate alcohol use,  Maternal aunt breast cancer, living, age 68,  Had chemo/radiation, surgery, Maternal aunt  Pancreatic cancer deceased   Allergies:NKA 9:54 AM  Wt Readings from Last 3 Encounters:  05/25/17 138 lb (62.6 kg)  05/04/17 138 lb (62.6 kg)  02/02/17 135 lb (61.2 kg)   BP (!) 144/95   Pulse 88   Temp 98.8 F (37.1 C) (Oral)   Resp 20   Ht 5' (1.524 m)   Wt 138 lb (62.6 kg)   LMP 05/11/2017 (Approximate)   BMI 26.95 kg/m     Rebecca Eaton, RN 05/23/2017,10:28 AM

## 2017-05-25 ENCOUNTER — Ambulatory Visit
Admission: RE | Admit: 2017-05-25 | Discharge: 2017-05-25 | Disposition: A | Discharge status: Home / Self Care | Payer: Managed Care, Other (non HMO) | Source: Ambulatory Visit | Attending: Radiation Oncology | Admitting: Radiation Oncology

## 2017-05-25 ENCOUNTER — Telehealth: Payer: Self-pay | Admitting: Obstetrics & Gynecology

## 2017-05-25 ENCOUNTER — Ambulatory Visit (HOSPITAL_BASED_OUTPATIENT_CLINIC_OR_DEPARTMENT_OTHER): Payer: Managed Care, Other (non HMO) | Admitting: Hematology and Oncology

## 2017-05-25 ENCOUNTER — Encounter: Payer: Self-pay | Admitting: *Deleted

## 2017-05-25 ENCOUNTER — Encounter: Payer: Self-pay | Admitting: Hematology and Oncology

## 2017-05-25 ENCOUNTER — Encounter: Payer: Self-pay | Admitting: Radiation Oncology

## 2017-05-25 VITALS — BP 144/95 | HR 88 | Temp 98.8°F | Resp 20 | Ht 60.0 in | Wt 138.0 lb

## 2017-05-25 DIAGNOSIS — Z809 Family history of malignant neoplasm, unspecified: Secondary | ICD-10-CM

## 2017-05-25 DIAGNOSIS — R112 Nausea with vomiting, unspecified: Secondary | ICD-10-CM | POA: Insufficient documentation

## 2017-05-25 DIAGNOSIS — M069 Rheumatoid arthritis, unspecified: Secondary | ICD-10-CM | POA: Insufficient documentation

## 2017-05-25 DIAGNOSIS — C50411 Malignant neoplasm of upper-outer quadrant of right female breast: Secondary | ICD-10-CM

## 2017-05-25 DIAGNOSIS — Z803 Family history of malignant neoplasm of breast: Secondary | ICD-10-CM | POA: Insufficient documentation

## 2017-05-25 DIAGNOSIS — D0511 Intraductal carcinoma in situ of right breast: Secondary | ICD-10-CM | POA: Diagnosis not present

## 2017-05-25 DIAGNOSIS — C50511 Malignant neoplasm of lower-outer quadrant of right female breast: Secondary | ICD-10-CM

## 2017-05-25 DIAGNOSIS — Z170 Estrogen receptor positive status [ER+]: Secondary | ICD-10-CM | POA: Diagnosis not present

## 2017-05-25 DIAGNOSIS — Z79899 Other long term (current) drug therapy: Secondary | ICD-10-CM | POA: Insufficient documentation

## 2017-05-25 DIAGNOSIS — R630 Anorexia: Secondary | ICD-10-CM | POA: Insufficient documentation

## 2017-05-25 DIAGNOSIS — M0579 Rheumatoid arthritis with rheumatoid factor of multiple sites without organ or systems involvement: Secondary | ICD-10-CM

## 2017-05-25 DIAGNOSIS — R109 Unspecified abdominal pain: Secondary | ICD-10-CM | POA: Insufficient documentation

## 2017-05-25 DIAGNOSIS — C50911 Malignant neoplasm of unspecified site of right female breast: Secondary | ICD-10-CM

## 2017-05-25 DIAGNOSIS — Z510 Encounter for antineoplastic radiation therapy: Secondary | ICD-10-CM | POA: Insufficient documentation

## 2017-05-25 HISTORY — DX: Malignant neoplasm of unspecified site of unspecified female breast: C50.919

## 2017-05-25 HISTORY — DX: Malignant neoplasm of unspecified site of right female breast: C50.911

## 2017-05-25 MED ORDER — TAMOXIFEN CITRATE 20 MG PO TABS: 20.0000 mg | ORAL_TABLET | Freq: Every day | ORAL | 3 refills | Status: DC

## 2017-05-25 NOTE — Telephone Encounter (Signed)
Patient is asking to talk with Dr.Miller's nurse to give information after her oncology appointment.

## 2017-05-25 NOTE — Progress Notes (Signed)
Please see the Nurse Progress Note in the MD Initial Consult Encounter for this patient. 

## 2017-05-25 NOTE — Assessment & Plan Note (Signed)
05/08/2017: Right breast biopsy 830 position: DCIS with calcifications, low-grade, ER 95%, PR 90%, Tis N0 stage 0; right breast 10:00:PASH Breast MRI: 1.3 cm mass right breast middle depth  Pathology review: I discussed with the patient the difference between DCIS and invasive breast cancer. It is considered a precancerous lesion. DCIS is classified as a 0. It is generally detected through mammograms as calcifications. We discussed the significance of grades and its impact on prognosis. We also discussed the importance of ER and PR receptors and their implications to adjuvant treatment options. Prognosis of DCIS dependence on grade, comedo necrosis. It is anticipated that if not treated, 20-30% of DCIS can develop into invasive breast cancer.  Recommendation: 1. Breast conserving surgery 2. Followed by adjuvant radiation therapy 3. Followed by antiestrogen therapy with tamoxifen 5 years  Tamoxifen counseling: We discussed the risks and benefits of tamoxifen. These include but not limited to insomnia, hot flashes, mood changes, vaginal dryness, and weight gain. Although rare, serious side effects including endometrial cancer, risk of blood clots were also discussed. We strongly believe that the benefits far outweigh the risks. Patient understands these risks and consented to starting treatment. Planned treatment duration is 5 years.  Because it would most likely take several weeks before her surgery, I recommended her to begin tamoxifen therapy.  Return to clinic after surgery to discuss the final pathology report and come up with an adjuvant treatment plan.

## 2017-05-25 NOTE — Telephone Encounter (Signed)
Spoke with patient. Patient states she was seen today by oncologist at [facility] for "low grade breast cancer". Will proceed with lumpectomy, was advised to stop Camila and start tamoxifen.   Patient is concerned with pain management r/t to menses. Has current Rx for Vicodin, will this still be an option prn?   Advised patient would review with Dr. Sabra Heck and return call with recommendations, patient is agreeable.   Dr. Sabra Heck -please review and advise?

## 2017-05-25 NOTE — Progress Notes (Signed)
Rose [name]  Patient Care Team: Merrilee Seashore, MD as PCP - General (Internal Medicine)  CHIEF COMPLAINTS/PURPOSE OF CONSULTATION:  Newly diagnosed right breast DCIS  HISTORY OF PRESENTING ILLNESS:  Rose [name] 46 y.o. female is here because of recent diagnosis of right breast DCIS.  Patient had a routine screening mammogram that detected some calcifications.  She had subsequently undergone couple of biopsies which were benign.  She then had a breast MRI and was noted to have 1.3 cm mass.  She then underwent another biopsy on 05/08/2017 which revealed low-grade DCIS that was ER PR positive.  She was seen by surgery who recommended lumpectomy.  She was also referred for genetic testing.  She has been taking progesterone replacement therapy for abdominal and uterine cramps.  She was instructed now that she needs to stop taking those medications.  I reviewed her records extensively and collaborated the history with the patient.  SUMMARY OF ONCOLOGIC HISTORY:   Ductal carcinoma in situ (DCIS) of right breast   04/20/2017 Breast MRI    Right breast middle to posterior depth 1.3 x 0.8 x 0.9 cm suspicious mass spiculated mass, several 4-5 mm enhancing foci, multiple bilateral enhancing foci that are less than 5 mm      05/08/2017 Initial Diagnosis    Right breast biopsy 830 position: DCIS with calcifications, low-grade, ER 95%, PR 90%, Tis N0 stage 0; right breast 10:00:PASH      05/25/2017 -  Anti-estrogen oral therapy    Tamoxifen 20 mg daily      MEDICAL HISTORY:  Past Medical History:  Diagnosis Date  . Abdominal pain   . Breast cancer ([place]) 05/17/2017   Right breast  . Chronic rheumatic arthritis ([place])   . Malignant neoplasm of right breast in female, estrogen receptor positive ([place]) 05/25/2017  . N&V (nausea and vomiting)   . Poor appetite     SURGICAL HISTORY: Past Surgical History:  Procedure Laterality Date  . APPENDECTOMY   4/12  . COLPOSCOPY  1999  . HYSTEROSCOPY WITH RESECTOSCOPE  11/10  . MEDIAL COLLATERAL LIGAMENT REPAIR, KNEE Left 05/04/2017   Procedure: LEFT THUMB  COLLATERAL LIGAMENT REPAIR;  Surgeon: Leanora Cover, MD;  Location: [facility];  Service: Orthopedics;  Laterality: Left;    SOCIAL HISTORY: Social History   Socioeconomic History  . Marital status: Married    Spouse name: Not on file  . Number of children: Not on file  . Years of education: Not on file  . Highest education level: Not on file  Social Needs  . Financial resource strain: Not on file  . Food insecurity - worry: Not on file  . Food insecurity - inability: Not on file  . Transportation needs - medical: Not on file  . Transportation needs - non-medical: Not on file  Occupational History  . Not on file  Tobacco Use  . Smoking status: Never Smoker  . Smokeless tobacco: Never Used  Substance and Sexual Activity  . Alcohol use: Yes    Alcohol/week: 3.0 - 3.6 oz    Types: 5 - 6 Standard drinks or equivalent per week    Comment: wine  . Drug use: No  . Sexual activity: Yes    Partners: Male    Birth control/protection: Pill  Other Topics Concern  . Not on file  Social History Narrative  . Not on file    FAMILY HISTORY: Family History  Problem Relation Age of Onset  .  Breast cancer Maternal Aunt        70's  . Pancreatic cancer Maternal Aunt     ALLERGIES:  has No Known Allergies.  MEDICATIONS:  Current Outpatient Medications  Medication Sig Dispense Refill  . Abatacept (ORENCIA) 125 MG/ML SOLN Inject into the skin. Every 4 weeks    . acetaminophen (TYLENOL) 500 MG tablet Take 650 mg by mouth every 6 (six) hours as needed.     . clindamycin-tretinoin (ZIANA) gel Apply topically daily.      . Dapsone (ACZONE) 5 % topical gel Apply topically 2 (two) times daily.    . fish oil-omega-3 fatty acids 1000 MG capsule Take 1,000 mg by mouth 2 (two) times daily.      . fluticasone (FLONASE) 50 MCG/ACT  nasal spray USE 2 SPRAYS IN EACH NOSTRIL ONCE DAILY AS NEEDED 30 DAYS  6  . HYDROcodone-acetaminophen (NORCO) 5-325 MG tablet 1-2 tabs po q6 hours prn pain (Patient not taking: Reported on 05/25/2017) 20 tablet 0  . hydroxychloroquine (PLAQUENIL) 200 MG tablet Take by mouth 2 (two) times daily.      . methocarbamol (ROBAXIN) 500 MG tablet Take 1 tablet (500 mg total) by mouth at bedtime as needed for muscle spasms. 12 tablet 0  . Multiple Vitamin (ONCE DAILY PO) Take by mouth 1 dose over 24 hours.      . naproxen (NAPROSYN) 500 MG tablet Take 1 tablet (500 mg total) by mouth 2 (two) times daily with a meal. (Patient not taking: Reported on 05/25/2017) 30 tablet 0  . norethindrone (CAMILA) 0.35 MG tablet Take 1 tablet (0.35 mg total) by mouth daily. 84 tablet 4  . Prenatal Vit-Fe Fumarate-FA (PNV PRENATAL PLUS MULTIVITAMIN) 27-1 MG TABS TAKE 1 TABLET EVERY DAY 30 tablet 11  . tamoxifen (NOLVADEX) 20 MG tablet Take 1 tablet (20 mg total) daily by mouth. 90 tablet 3  . valACYclovir (VALTREX) 1000 MG tablet TAKE 1/2 TABLET BY MOUTH DAILY 30 tablet 2   No current facility-administered medications for this visit.     REVIEW OF SYSTEMS:   Constitutional: Denies fevers, chills or abnormal night sweats Eyes: Denies blurriness of vision, double vision or watery eyes Ears, nose, mouth, throat, and face: Denies mucositis or sore throat Respiratory: Denies cough, dyspnea or wheezes Cardiovascular: Denies palpitation, chest discomfort or lower extremity swelling Gastrointestinal:  Denies nausea, heartburn or change in bowel habits Skin: Denies abnormal skin rashes Lymphatics: Denies new lymphadenopathy or easy bruising Neurological:Denies numbness, tingling or new weaknesses Behavioral/Psych: Mood is stable, no new changes  Breast:  Denies any palpable lumps or discharge All other systems were reviewed with the patient and are negative.  PHYSICAL EXAMINATION: ECOG PERFORMANCE STATUS: 0 -  Asymptomatic  Vitals:   05/25/17 1329  BP: 134/87  Pulse: 98  Resp: 20  Temp: 98.1 F (36.7 C)  SpO2: 100%   Filed Weights   05/25/17 1329  Weight: 142 lb (64.4 kg)    GENERAL:alert, no distress and comfortable SKIN: skin color, texture, turgor are normal, no rashes or significant lesions EYES: normal, conjunctiva are pink and non-injected, sclera clear OROPHARYNX:no exudate, no erythema and lips, buccal mucosa, and tongue normal  NECK: supple, thyroid normal size, non-tender, without nodularity LYMPH:  no palpable lymphadenopathy in the cervical, axillary or inguinal LUNGS: clear to auscultation and percussion with normal breathing effort HEART: regular rate & rhythm and no murmurs and no lower extremity edema ABDOMEN:abdomen soft, non-tender and normal bowel sounds Musculoskeletal:no cyanosis of digits and no  clubbing  PSYCH: alert & oriented x 3 with fluent speech NEURO: no focal motor/sensory deficits BREAST: No palpable nodules in breast. No palpable axillary or supraclavicular lymphadenopathy (exam performed in the presence of a chaperone)   LABORATORY DATA:  I have reviewed the data as listed Lab Results  Component Value Date   WBC 7.1 11/04/2010   HGB 13.9 11/04/2010   HCT 41.1 11/04/2010   MCV 86.5 11/04/2010   PLT 208 11/04/2010   Lab Results  Component Value Date   NA 132 (L) 11/04/2010   K 3.7 11/04/2010   CL 99 11/04/2010   CO2 23 11/04/2010    RADIOGRAPHIC STUDIES: I have personally reviewed the radiological reports and agreed with the findings in the report.  ASSESSMENT AND PLAN:  Ductal carcinoma in situ (DCIS) of right breast 05/08/2017: Right breast biopsy 830 position: DCIS with calcifications, low-grade, ER 95%, PR 90%, Tis N0 stage 0; right breast 10:00:PASH Breast MRI: 1.3 cm mass right breast middle depth  Pathology review: I discussed with the patient the difference between DCIS and invasive breast cancer. It is considered a precancerous  lesion. DCIS is classified as a 0. It is generally detected through mammograms as calcifications. We discussed the significance of grades and its impact on prognosis. We also discussed the importance of ER and PR receptors and their implications to adjuvant treatment options. Prognosis of DCIS dependence on grade, comedo necrosis. It is anticipated that if not treated, 20-30% of DCIS can develop into invasive breast cancer.  Recommendation: 1. Breast conserving surgery 2. Followed by adjuvant radiation therapy 3. Followed by antiestrogen therapy with tamoxifen 5 years  Tamoxifen counseling: We discussed the risks and benefits of tamoxifen. These include but not limited to insomnia, hot flashes, mood changes, vaginal dryness, and weight gain. Although rare, serious side effects including endometrial cancer, risk of blood clots were also discussed. We strongly believe that the benefits far outweigh the risks. Patient understands these risks and consented to starting treatment. Planned treatment duration is 5 years.  Instructed the patient to stop progesterone pills Because it would most likely take several weeks before her surgery, I recommended her to begin tamoxifen therapy.  Return to clinic after surgery to discuss the final pathology report and come up with an adjuvant treatment plan.  All questions were answered. The patient knows to call the clinic with any problems, questions or concerns.    Rulon Eisenmenger, MD 05/25/17

## 2017-05-25 NOTE — Progress Notes (Signed)
Radiation Oncology         [phone] ________________________________  Name: Rose [name]        MRN: [id number]  Date of Service: 05/25/2017 DOB: [birth year]  HU:[id number], Mauro Kaufmann, MD  Erroll Luna, MD     REFERRING PHYSICIAN: Erroll Luna, MD   DIAGNOSIS: The encounter diagnosis was Malignant neoplasm of lower-outer quadrant of right breast of female, estrogen receptor positive ([place]).   HISTORY OF PRESENT ILLNESS: Rose [name] is a 46 y.o. female seen at the request of Dr. Brantley Stage for a new diagnosis of DCIS of the right breast. She noted nipple bleeding and underwent diagnostic imaging which revealed a 6 x 3 x 6 mm mass at 4:00 which had previously been 8 x 4 x7 mm. She underwent a biopsy on 03/20/17 revealing fibrocystic change at the 4:00 position. A bilateral breast MRI on 04/20/17 revealed a 1.3 x .8 x .8 cm mass posteriorly in the right breast and several 4-5 mm enhancing foci, bilaterally. No correlate to ultrasound was seen and  on 05/08/17, am MRI guided biopsy at 8:30 revealed ER/PR positive, grade grade 1 DCIS with calcifications. A 10:00 biopsy was negative for malignancy and revealed fibrocystic changes with adenosis and calcifications. And additional biopsy on 05/17/17 revealed benign fibrocystic changes in the right breast. She comes today to discuss options of treatment for her cancer, and is awaiting a surgical date. She also plans to see Dr. Lindi Adie this afternoon. She also has a planned appointment with genetic counseling.   PREVIOUS RADIATION THERAPY: No   PAST MEDICAL HISTORY:  Past Medical History:  Diagnosis Date  . Abdominal pain   . Breast cancer ([place]) 05/17/2017   Right breast  . Chronic rheumatic arthritis ([place])   . N&V (nausea and vomiting)   . Poor appetite        PAST SURGICAL HISTORY: Past Surgical History:  Procedure Laterality Date  . APPENDECTOMY  4/12  . COLPOSCOPY  1999  . HYSTEROSCOPY WITH RESECTOSCOPE  11/10    . MEDIAL COLLATERAL LIGAMENT REPAIR, KNEE Left 05/04/2017   Procedure: LEFT THUMB  COLLATERAL LIGAMENT REPAIR;  Surgeon: Leanora Cover, MD;  Location: [facility];  Service: Orthopedics;  Laterality: Left;     FAMILY HISTORY:  Family History  Problem Relation Age of Onset  . Breast cancer Maternal Aunt      SOCIAL HISTORY:  reports that  has never smoked. she has never used smokeless tobacco. She reports that she drinks about 3.0 - 3.6 oz of alcohol per week. She reports that she does not use drugs. The patient is married and lives in [place]. 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     ALLERGIES: Patient has no known allergies.   MEDICATIONS:  Current Outpatient Medications  Medication Sig Dispense Refill  . Abatacept (ORENCIA) 125 MG/ML SOLN Inject into the skin. Every 4 weeks    . acetaminophen (TYLENOL) 500 MG tablet Take 650 mg by mouth every 6 (six) hours as needed.     . clindamycin-tretinoin (ZIANA) gel Apply topically daily.      . Dapsone (ACZONE) 5 % topical gel Apply topically 2 (two) times daily.    . fish oil-omega-3 fatty acids 1000 MG capsule Take 1,000 mg by mouth 2 (two) times daily.      . fluticasone (FLONASE) 50 MCG/ACT nasal spray USE 2 SPRAYS IN EACH NOSTRIL ONCE DAILY AS NEEDED 30 DAYS  6  .  HYDROcodone-acetaminophen (NORCO) 5-325 MG tablet 1-2 tabs po q6 hours prn pain 20 tablet 0  . HYDROcodone-acetaminophen (NORCO/VICODIN) 5-325 MG tablet Take 1-2 tablets by mouth every 6 (six) hours as needed for moderate pain or severe pain. 30 tablet 0  . hydroxychloroquine (PLAQUENIL) 200 MG tablet Take by mouth 2 (two) times daily.      . methocarbamol (ROBAXIN) 500 MG tablet Take 1 tablet (500 mg total) by mouth at bedtime as needed for muscle spasms. 12 tablet 0  . Multiple Vitamin (ONCE DAILY PO) Take by mouth 1 dose over 24 hours.      . naproxen (NAPROSYN) 500 MG tablet Take 1 tablet (500 mg  total) by mouth 2 (two) times daily with a meal. 30 tablet 0  . norethindrone (CAMILA) 0.35 MG tablet Take 1 tablet (0.35 mg total) by mouth daily. 84 tablet 4  . Prenatal Vit-Fe Fumarate-FA (PNV PRENATAL PLUS MULTIVITAMIN) 27-1 MG TABS TAKE 1 TABLET EVERY DAY 30 tablet 11  . valACYclovir (VALTREX) 1000 MG tablet TAKE 1/2 TABLET BY MOUTH DAILY 30 tablet 2   No current facility-administered medications for this encounter.      REVIEW OF SYSTEMS: On review of systems, the patient reports that she is doing well overall. She denies any chest pain, shortness of breath, cough, fevers, chills, night sweats, unintended weight changes. She denies any bowel or bladder disturbances, and denies abdominal pain, nausea or vomiting. She denies any new musculoskeletal or joint aches or pains. A complete review of systems is obtained and is otherwise negative.     PHYSICAL EXAM:  Wt Readings from Last 3 Encounters:  05/04/17 138 lb (62.6 kg)  02/02/17 135 lb (61.2 kg)  01/05/17 135 lb (61.2 kg)   Temp Readings from Last 3 Encounters:  05/04/17 97.6 F (36.4 C)  02/02/17 99.4 F (37.4 C) (Oral)  01/05/17 98.4 F (36.9 C) (Oral)   BP Readings from Last 3 Encounters:  05/04/17 (!) 146/82  02/02/17 (!) 150/94  01/05/17 (!) 135/94   Pulse Readings from Last 3 Encounters:  05/04/17 82  02/02/17 91  01/05/17 88   In general this is a well appearing African American female in no acute distress. She is alert and oriented x4 and appropriate throughout the examination. HEENT reveals that the patient is normocephalic, atraumatic. EOMs are intact. Cardiopulmonary assessment is negative for acute distress and she exhibits normal effort. Breast exam is deferred.  ECOG = 1  0 - Asymptomatic (Fully active, able to carry on all predisease activities without restriction)  1 - Symptomatic but completely ambulatory (Restricted in physically strenuous activity but ambulatory and able to carry out work of a light  or sedentary nature. For example, light housework, office work)  2 - Symptomatic, <50% in bed during the day (Ambulatory and capable of all self care but unable to carry out any work activities. Up and about more than 50% of waking hours)  3 - Symptomatic, >50% in bed, but not bedbound (Capable of only limited self-care, confined to bed or chair 50% or more of waking hours)  4 - Bedbound (Completely disabled. Cannot carry on any self-care. Totally confined to bed or chair)  5 - Death   Eustace Pen MM, Creech RH, Tormey DC, et al. [id number]). "Toxicity and response criteria of the [facility]". [place] Oncol. 5 (6): 649-55    LABORATORY DATA:  Lab Results  Component Value Date   WBC 7.1 11/04/2010   HGB 13.9 11/04/2010  HCT 41.1 11/04/2010   MCV 86.5 11/04/2010   PLT 208 11/04/2010   Lab Results  Component Value Date   NA 132 (L) 11/04/2010   K 3.7 11/04/2010   CL 99 11/04/2010   CO2 23 11/04/2010   No results found for: ALT, AST, GGT, ALKPHOS, BILITOT    RADIOGRAPHY: US Breast Ltd Uni Right Inc Axilla  Result Date: 05/03/2017 CLINICAL DATA:  46 year old female presenting for second-look ultrasound from a breast MRI from 04/20/2017. EXAM: ULTRASOUND OF THE RIGHT BREAST COMPARISON:  Previous exam(s). FINDINGS: On physical exam, no discrete palpable masses are identified in the lateral aspect of the right breast. Targeted ultrasound is performed, showing normal extremely dense fibroglandular tissue. No definite suspicious masses are identified. IMPRESSION: No correlate is identified on today's ultrasound to account for the mass identified on MRI in the lateral right breast. RECOMMENDATION: MRI guided biopsy is recommended for 2 of the lesions described in the MRI report. Her MRI biopsy has been scheduled for 05/08/2017 at 7:30 a.m. I have discussed the findings and recommendations with the patient. Results were also provided in writing at the conclusion of the  visit. If applicable, a reminder letter will be sent to the patient regarding the next appointment. BI-RADS CATEGORY  1: Negative. Electronically Signed   By: Ammie Ferrier M.D.   On: 05/03/2017 09:54   Mm Clip Placement Right  Result Date: 05/17/2017 CLINICAL DATA:  Post MRI guided biopsy of an enhancing focus in the upper central right breast. EXAM: DIAGNOSTIC RIGHT MAMMOGRAM POST MRI BIOPSY COMPARISON:  Previous exam(s). FINDINGS: Mammographic images were obtained following MRI guided biopsy of a small enhancing focus in the far upper central right breast. A dumbbell shaped biopsy marking clip is present and appears appropriately positioned at the site of the MRI biopsy, and appears to be located superior and upper outer on the post biopsy mammogram. An additional dumbbell shaped biopsy marking clip is present in the 10 o'clock position of the right breast at site of prior benign MRI guided biopsy and a cylindrical shaped biopsy marking clip is present at the 8:30 position at site of MRI guided biopsy with pathology revealing DCIS in this location. IMPRESSION: Dumbbell shaped biopsy marking clip appropriately positioned in the far upper central to outer right breast post MRI guided biopsy of a small enhancing focus. Final Assessment: Post Procedure Mammograms for Marker Placement Electronically Signed   By: Everlean Alstrom M.D.   On: 05/17/2017 09:25   Mm Clip Placement Right  Result Date: 05/08/2017 CLINICAL DATA:  Post MRI guided core needle biopsy of 2 enhancing masses in the lateral right breast. EXAM: DIAGNOSTIC RIGHT MAMMOGRAM POST MRI BIOPSY COMPARISON:  Previous exam(s). FINDINGS: Mammographic images were obtained following MRI guided biopsy of 2 right breast masses. Cylinder-shaped tissue marker is located in the slightly upper outer right breast, and corresponds to the lesion labeled 830 o'clock, and dumbbell-shaped tissue marker is located in the upper outer quadrant, and corresponds to  the lesion labeled 10 o'clock. The cylinder-shaped marker is located slightly more superiorly than expected, which is favored to be due to differences in positioning from MRI to mammography. IMPRESSION: Successful placement of tissue markers, post MRI guided core needle biopsy of 2 right breast enhancing masses: 1.  8:30 o'clock, cylinder-shaped marker. 2. 10 o'clock, dumbbell-shaped marker. Final Assessment: Post Procedure Mammograms for Marker Placement Electronically Signed   By: Fidela Salisbury M.D.   On: 05/08/2017 10:55   Mr Rt Breast Bx W Loc  Dev 1st Lesion Image Bx Spec Mr Guide  Addendum Date: 05/18/2017   ADDENDUM REPORT: 05/18/2017 12:00 ADDENDUM: Pathology revealed FIBROCYSTIC CHANGE WITH CALCIFICATIONS, USUAL DUCTAL HYPERPLASIA of the Right breast, upper central. This was found to be concordant by Dr. Everlean Alstrom. Pathology results were discussed with the patient by telephone. The patient reported doing well after the biopsy with tenderness at the site. Post biopsy instructions and care were reviewed and questions were answered. The patient was encouraged to call [facility] for any additional concerns. The patient has a recent diagnosis of right breast cancer and should follow her outlined treatment plan. Pathology results reported by Terie Purser, RN on 05/18/2017. Electronically Signed   By: Everlean Alstrom M.D.   On: 05/18/2017 12:00   Addendum Date: 05/17/2017   ADDENDUM REPORT: 05/17/2017 17:07 ADDENDUM: Biopsy of 2 additional enhancing foci in the right breast was recommended, however only one MRI guided biopsy of the right breast was performed. The patient has marked background enhancement with innumerable bilateral enhancing foci. There were no additional enhancing masses or foci that appeared to stand out differently than the patient's background enhancement. Electronically Signed   By: Everlean Alstrom M.D.   On: 05/17/2017 17:07   Result Date:  05/18/2017 CLINICAL DATA:  46 year old female with recently diagnosed right breast DCIS post MRI guided biopsy of an enhancing mass in the upper-outer right breast. Two additional previous biopsies, including an ultrasound guided right breast biopsy and MR guided right breast biopsy were both benign. EXAM: MRI GUIDED CORE NEEDLE BIOPSY OF THE RIGHT BREAST TECHNIQUE: Multiplanar, multisequence MR imaging of the right breast was performed both before and after administration of intravenous contrast. CONTRAST:  24m MULTIHANCE GADOBENATE DIMEGLUMINE 529 MG/ML IV SOLN COMPARISON:  Previous exams. FINDINGS: I met with the patient, and we discussed the procedure of MRI guided biopsy, including risks, benefits, and alternatives. Specifically, we discussed the risks of infection, bleeding, tissue injury, clip migration, and inadequate sampling. Informed, written consent was given. The usual time out protocol was performed immediately prior to the procedure. Using sterile technique, 1% Lidocaine, MRI guidance, and a 9 gauge vacuum assisted device, biopsy was performed of a small enhancing focus in the upper central right breast using a lateral to medial approach. At the conclusion of the procedure, a dumbbell shaped tissue marker clip was deployed into the biopsy cavity. Follow-up 2-view mammogram was performed and dictated separately. IMPRESSION: MRI guided biopsy of a small enhancing focus in the upper central right breast. No apparent complications. Electronically Signed: By: JEverlean AlstromM.D. On: 05/17/2017 09:27   Mr Rt Breast Bx WJohnella MoloneyDev 1st Lesion Image Bx Spec Mr Guide  Addendum Date: 05/10/2017   ADDENDUM REPORT: 05/10/2017 14:50 ADDENDUM: Pathology revealed PSEUDOANGIOMATOUS STROMAL HYPERPLASIA, FIBROCYSTIC CHANGES WITH ADENOSIS AND CALCIFICATIONS of the Right breast, 10:00 o'clock (dumbbell clip). This was found to be concordant by Dr. DFidela Salisbury Pathology revealed LOW GRADE DUCTAL CARCINOMA IN  SITU WITH CALCIFICATIONS of the Right breast, 8:30 o'clock (cylinder clip). This was found to be concordant by Dr. DFidela Salisbury Pathology results were discussed with the patient by telephone. The patient reported doing well after the biopsies with tenderness and bleeding at the sites. Post biopsy instructions and care were reviewed and questions were answered. The [facility]. [facility]. The  patient is scheduled for two additional Right breast MRI guided biopsies on May 17, 2017. Pathology results reported by Terie Purser, RN on 05/10/2017. Electronically Signed   By: Fidela Salisbury M.D.   On: 05/10/2017 14:50   Result Date: 05/10/2017 CLINICAL DATA:  Right breast 830 o'clock enhancing mass and right breast 10 o'clock satellite nodule. EXAM: MRI GUIDED CORE NEEDLE BIOPSY OF THE RIGHT BREAST TECHNIQUE: Multiplanar, multisequence MR imaging of the right breast was performed both before and after administration of intravenous contrast. CONTRAST:  12m MULTIHANCE GADOBENATE DIMEGLUMINE 529 MG/ML IV SOLN COMPARISON:  Previous exams. FINDINGS: I met with the patient, and we discussed the procedure of MRI guided biopsy, including risks, benefits, and alternatives. Specifically, we discussed the risks of infection, bleeding, tissue injury, clip migration, and inadequate sampling. Informed, written consent was given. The usual time out protocol was performed immediately prior to the procedure. Using sterile technique, 1% Lidocaine, MRI guidance, and a 9 gauge vacuum assisted device, biopsy was performed of right breast 830 o'clock mass using a lateral approach. At the conclusion of the procedure, a cylinder shaped tissue marker clip was deployed into the biopsy cavity. Using sterile technique, 1% Lidocaine, MRI guidance,  and a 9 gauge vacuum assisted device, biopsy was performed of right breast 10 o'clock mass using a lateral approach. At the conclusion of the procedure, a dumbbell-shaped tissue marker clip was deployed into the biopsy cavity. Follow-up 2-view mammogram was performed and dictated separately. IMPRESSION: MRI guided biopsy of right breast 830 o'clock enhancing mass and right breast 10 o'clock presumed satellite nodule. No apparent complications. Electronically Signed: By: DFidela SalisburyM.D. On: 05/08/2017 10:08   Mr Rt Breast Bx W Loc Dev Ea Add Lesion Image Bx Spec Mr Guide  Addendum Date: 05/10/2017   ADDENDUM REPORT: 05/10/2017 14:50 ADDENDUM: Pathology revealed PSEUDOANGIOMATOUS STROMAL HYPERPLASIA, FIBROCYSTIC CHANGES WITH ADENOSIS AND CALCIFICATIONS of the Right breast, 10:00 o'clock (dumbbell clip). This was found to be concordant by Dr. DFidela Salisbury Pathology revealed LOW GRADE DUCTAL CARCINOMA IN SITU WITH CALCIFICATIONS of the Right breast, 8:30 o'clock (cylinder clip). This was found to be concordant by Dr. DFidela Salisbury Pathology results were discussed with the patient by telephone. The patient reported doing well after the biopsies with tenderness and bleeding at the sites. Post biopsy instructions and care were reviewed and questions were answered. The patient was [facility] The patient is scheduled for two additional Right breast MRI guided biopsies on May 17, 2017. Pathology results reported by LTerie Purser RN on 05/10/2017. Electronically Signed   By: DFidela SalisburyM.D.   On: 05/10/2017 14:50   Result Date: 05/10/2017 CLINICAL DATA:  Right breast 830 o'clock enhancing mass and right breast 10 o'clock satellite nodule. EXAM: MRI GUIDED CORE NEEDLE BIOPSY OF THE RIGHT BREAST  TECHNIQUE: Multiplanar, multisequence MR imaging of the right breast was performed both before and after administration of intravenous contrast. CONTRAST:  178mMULTIHANCE GADOBENATE DIMEGLUMINE 529 MG/ML IV SOLN COMPARISON:  Previous exams. FINDINGS: I met with the patient, and we discussed the procedure of MRI guided biopsy, including risks, benefits, and alternatives. Specifically, we discussed the risks of infection, bleeding, tissue injury, clip migration, and inadequate sampling. Informed, written consent was given. The usual time out protocol was performed immediately prior to the procedure. Using sterile technique, 1% Lidocaine, MRI guidance,  and a 9 gauge vacuum assisted device, biopsy was performed of right breast 830 o'clock mass using a lateral approach. At the conclusion of the procedure, a cylinder shaped tissue marker clip was deployed into the biopsy cavity. Using sterile technique, 1% Lidocaine, MRI guidance, and a 9 gauge vacuum assisted device, biopsy was performed of right breast 10 o'clock mass using a lateral approach. At the conclusion of the procedure, a dumbbell-shaped tissue marker clip was deployed into the biopsy cavity. Follow-up 2-view mammogram was performed and dictated separately. IMPRESSION: MRI guided biopsy of right breast 830 o'clock enhancing mass and right breast 10 o'clock presumed satellite nodule. No apparent complications. Electronically Signed: By: Fidela Salisbury M.D. On: 05/08/2017 10:08       IMPRESSION/PLAN: 1. Grade 1, ER/PR positive DCIS of the right breast. Dr. Lisbeth Renshaw discusses the pathology findings and reviews the nature of in situ breast disease. The patient plans to undergo breast conservation with lumpectomy, and we discussed that for patients who undergo conservation lumpectomy, that we would recommend a course of external radiotherapy to the breast. She will meet with Dr. Lindi Adie today as well and we discussed that after radiation, her treatment would  be followed by antiestrogen therapy. We discussed the risks, benefits, short, and long term effects of radiotherapy, and the patient is interested in proceeding. Dr. Lisbeth Renshaw discusses the delivery and logistics of radiotherapy and anticipates a course of 6 1/2 weeks. We will see her back about 2 weeks after surgery to move forward with the simulation and planning process and anticipate starting radiotherapy about 4 weeks after surgery.  2. 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 The patient will meet with genetic counseling on Monday of next week. 3. Rheumatoid Arthritis. The patient is on Orencia for this and will be able to continue during therapy. We will copy Dr. Amil Amen on our discussion. 4. Contraception. The patient is using oral progesterone and I advised her that. Dr. Lindi Adie will want to review this with her today as she is still having menstrual cycles and has PR positivity in her breast tumor. I will copy her OB/GYN on this as well.  In a visit lasting 45 minutes, greater than 50% of the time was spent face to face discussing her course, and coordinating the patient's care.  The above documentation reflects my direct findings during this shared patient visit. Please see the separate note by Dr. Lisbeth Renshaw on this date for the remainder of the patient's plan of care.    Carola Rhine, PAC

## 2017-05-25 NOTE — Addendum Note (Signed)
Encounter addended by: Hayden Pedro, PA-C on: 05/25/2017 11:22 AM  Actions taken: Sign clinical note, Follow-up modified

## 2017-05-25 NOTE — Addendum Note (Signed)
Encounter addended by: Hayden Pedro, PA-C on: 05/25/2017 11:13 AM  Actions taken: Visit diagnoses modified, Problem List reviewed, Sign clinical note, Follow-up modified

## 2017-05-29 ENCOUNTER — Ambulatory Visit (HOSPITAL_BASED_OUTPATIENT_CLINIC_OR_DEPARTMENT_OTHER): Payer: Managed Care, Other (non HMO) | Admitting: Genetic Counselor

## 2017-05-29 ENCOUNTER — Encounter: Payer: Self-pay | Admitting: Genetic Counselor

## 2017-05-29 ENCOUNTER — Other Ambulatory Visit: Payer: Managed Care, Other (non HMO)

## 2017-05-29 DIAGNOSIS — Z803 Family history of malignant neoplasm of breast: Secondary | ICD-10-CM | POA: Diagnosis not present

## 2017-05-29 DIAGNOSIS — D0511 Intraductal carcinoma in situ of right breast: Secondary | ICD-10-CM

## 2017-05-29 DIAGNOSIS — Z800 Family history of malignant neoplasm of digestive organs: Secondary | ICD-10-CM

## 2017-05-29 NOTE — Telephone Encounter (Signed)
I think that having some Vicodin on hand is ok but we will need to see how much she ends up using.  She will need to come in anytime she needs a refill.  She is very reasonable and has typically had one prescription for a year so I think we will just need to watch and see how this works for her.  Hope that sounds ok to her.  Thanks.

## 2017-05-29 NOTE — Telephone Encounter (Signed)
Spoke with patient, advised as seen below per Dr. Sabra Heck. Patient agreeable with plan. Patient would like Dr. Sabra Heck to know that she was seen for genetics testing today at [facility], has requested copy of results to Dr. Sabra Heck.   Routing to provider for final review. Patient is agreeable to disposition. Will close encounter.

## 2017-05-29 NOTE — Telephone Encounter (Signed)
Left message to call Rose [name] at [phone].  

## 2017-05-29 NOTE — Progress Notes (Signed)
REFERRING PROVIDER: Nicholas Lose, MD [place], [place] [id number]  PRIMARY PROVIDER:  Merrilee Seashore, MD  PRIMARY REASON FOR VISIT:  1. Ductal carcinoma in situ (DCIS) of right breast   2. Family history of breast cancer   3. Family history of pancreatic cancer   4. Family history of colon cancer      HISTORY OF PRESENT ILLNESS:   Rose [name], a 46 y.o. female, was seen for a [facility] cancer genetics consultation at the request of Dr. Lindi Adie due to a personal and family history of cancer.  Rose [name] presents to clinic today to discuss the possibility of a hereditary predisposition to cancer, genetic testing, and to further clarify her future cancer risks, as well as potential cancer risks for family members.   In October 2018, at the age of 59, Rose [name] was diagnosed with DCIS of the right breast.  The tumor is ER/PR+. This will be treated with lumpectomy on June 27, 2017, radiation and tamoxifen.  The patient is unaware of any genetic testing that has been performed in the family.      CANCER HISTORY:    Ductal carcinoma in situ (DCIS) of right breast   04/20/2017 Breast MRI    Right breast middle to posterior depth 1.3 x 0.8 x 0.9 cm suspicious mass spiculated mass, several 4-5 mm enhancing foci, multiple bilateral enhancing foci that are less than 5 mm      05/08/2017 Initial Diagnosis    Right breast biopsy 830 position: DCIS with calcifications, low-grade, ER 95%, PR 90%, Tis N0 stage 0; right breast 10:00:PASH      05/25/2017 -  Anti-estrogen oral therapy    Tamoxifen 20 mg daily        HORMONAL RISK FACTORS:  Menarche was at age 60-10.  First live birth at age N/A.  OCP use for approximately 19 years.  Ovaries intact: yes.  Hysterectomy: no.  Menopausal status: premenopausal.  HRT use: 0 years. Colonoscopy: no; not examined. Mammogram within the last year: yes. Number of breast biopsies: 4. Up to date with  pelvic exams:  yes. Any excessive radiation exposure in the past:  no  Past Medical History:  Diagnosis Date  . Abdominal pain   . Breast cancer ([place]) 05/17/2017   Right breast  . Chronic rheumatic arthritis ([place])   . Family history of breast cancer   . Family history of colon cancer   . Family history of pancreatic cancer   . Malignant neoplasm of right breast in female, estrogen receptor positive ([place]) 05/25/2017  . N&V (nausea and vomiting)   . Poor appetite     Past Surgical History:  Procedure Laterality Date  . APPENDECTOMY  4/12  . COLPOSCOPY  1999  . HYSTEROSCOPY WITH RESECTOSCOPE  11/10  . LEFT THUMB  COLLATERAL LIGAMENT REPAIR Left 05/04/2017   Performed by Leanora Cover, MD at [place] History   Socioeconomic History  . Marital status: Married    Spouse name: Not on file  . Number of children: Not on file  . Years of education: Not on file  . Highest education level: Not on file  Social Needs  . Financial resource strain: Not on file  . Food insecurity - worry: Not on file  . Food insecurity - inability: Not on file  . Transportation needs - medical: Not on file  . Transportation needs - non-medical: Not on file  Occupational History  .  Not on file  Tobacco Use  . Smoking status: Never Smoker  . Smokeless tobacco: Never Used  Substance and Sexual Activity  . Alcohol use: Yes    Alcohol/week: 3.0 - 3.6 oz    Types: 5 - 6 Standard drinks or equivalent per week    Comment: wine  . Drug use: No  . Sexual activity: Yes    Partners: Male    Birth control/protection: Pill  Other Topics Concern  . Not on file  Social History Narrative  . Not on file     FAMILY HISTORY:  We obtained a detailed, 4-generation family history.  Significant diagnoses are listed below: Family History  Problem Relation Age of Onset  . Breast cancer Maternal Aunt        70's  . Pancreatic cancer Maternal Aunt 63  . Colon cancer Other        PGF's  sister    The patient does not have children.  She has one sister who is cancer free and does not have children.  Both parents are living.  The patient's mother has two sisters and a brother.  One sister had pancreatic cancer at 33 and the other sister had breast cancer at 55.  The sister with breast cancer is a twin of the brother, who has never had cancer.  Both maternal grandparents are deceased in their 66's-80's.  The patient's father has nine full siblings, 8 brothers and one sister, and three paternal half sisters.  None of his siblings have a diagnosis of cancer.  The patient's paternal grandparents are deceased.  The grandfather had a sister who had colon cancer in her 18's.  Rose [name] is unaware of previous family history of genetic testing for hereditary cancer risks. Patient's maternal ancestors are of [place] and [place] descent, and paternal ancestors are of [place] American descent. There is no reported Ashkenazi Jewish ancestry. There is no known consanguinity.  GENETIC COUNSELING ASSESSMENT: Rose [name] is a 46 y.o. female with a personal history of DCIS breast cancer and family history of breast, pancreatic and colon cancer which is somewhat suggestive of a hereditary cancer syndrome and predisposition to cancer. We, therefore, discussed and recommended the following at today's visit.   DISCUSSION: We discussed that about 5-10% of breast cancer is hereditary, with most cases due to BRCA mutations.  We discussed that other genes, such as PALB2 and ATM, are associated with family histories of breast cancer and pancreatic cancer, and CHEK2 is associated with hereditary breast cancer syndromes.  We reviewed the characteristics, features and inheritance patterns of hereditary cancer syndromes. We also discussed genetic testing, including the appropriate family members to test, the process of testing, insurance coverage and turn-around-time for results. We  discussed the implications of a negative, positive and/or variant of uncertain significant result. We recommended Rose [name] pursue genetic testing for the comprehensive Common Cancer gene panel. The Comprehensive Common Cancer Gene Panel offered by [facility] includes sequencing and/or deletion duplication testing of the following 47 genes: APC, ATM, AXIN2, BAP1, BARD1, BMPR1A, BRCA1, BRCA2, BRIP1, CDH1, CDK4, CDKN2A, CHEK2, EPCAM (Deletion/duplication testing only), FANCC, FH, FLCN, GREM1 (promoter region deletion/duplication testing only), HOXB13, MET, MITF, MLH1, MSH2, MSH6, MUTYH, NBN, NF1, NHTL1, PALB2, PMS2, POLD1, POLE, POT1, PTEN, RAD51C, RAD51D, RECQL, SDHB, SDHC, SDHD, SMAD4, STK11, TP53, TSC1, TSC2, and VHL.    Based on Rose [name]'s personal and family history of cancer, she meets medical criteria for genetic testing. Despite that she meets criteria, she  may still have an out of pocket cost. We discussed that if her out of pocket cost for testing is over $100, the laboratory will call and confirm whether she wants to proceed with testing.  If the out of pocket cost of testing is less than $100 she will be billed by the genetic testing laboratory.   PLAN: After considering the risks, benefits, and limitations, Rose [name]  provided informed consent to pursue genetic testing and the blood sample was sent to [facility] for analysis of the Comprehensive Common Cancer panel. Results should be available within approximately 2-3 weeks' time, at which point they will be disclosed by telephone to Rose [name], as will any additional recommendations warranted by these results. Rose [name] will receive a summary of her genetic counseling visit and a copy of her results once available. This information will also be available in Epic. We encouraged Rose [name] to remain in contact with cancer genetics annually so that we can continuously update the family history and inform her of  any changes in cancer genetics and testing that may be of benefit for her family. Rose [name] questions were answered to her satisfaction today. Our contact information was provided should additional questions or concerns arise.  Lastly, we encouraged Rose [name] to remain in contact with cancer genetics annually so that we can continuously update the family history and inform her of any changes in cancer genetics and testing that may be of benefit for this family.   Ms.  Rose [name] questions were answered to her satisfaction today. Our contact information was provided should additional questions or concerns arise. Thank you for the referral and allowing [place] to share in the care of your patient.   Rylon Poitra P. Florene Glen, [place], [facility] Certified Genetic Counselor [email or web address] phone: [phone]  The patient was seen for a total of 45 minutes in face-to-face genetic counseling.  This patient was discussed with Drs. Magrinat, Lindi Adie and/or Burr Medico who agrees with the above.    _______________________________________________________________________ For Office Staff:  Number of people involved in session: 1 Was an Intern/ student involved with case: no

## 2017-05-30 ENCOUNTER — Other Ambulatory Visit: Payer: Self-pay | Admitting: Surgery

## 2017-05-30 DIAGNOSIS — D0511 Intraductal carcinoma in situ of right breast: Secondary | ICD-10-CM

## 2017-06-02 ENCOUNTER — Telehealth: Payer: Self-pay | Admitting: Hematology and Oncology

## 2017-06-02 NOTE — Telephone Encounter (Signed)
Per 11/15 - no los at check out

## 2017-06-05 ENCOUNTER — Telehealth: Payer: Self-pay | Admitting: Hematology and Oncology

## 2017-06-05 NOTE — Telephone Encounter (Signed)
Scheduled appt per 11/20 sch msg. Spoke with patient regarding the appt that was added.

## 2017-06-06 ENCOUNTER — Encounter: Payer: Self-pay | Admitting: Radiation Oncology

## 2017-06-10 HISTORY — PX: BREAST LUMPECTOMY: SHX2

## 2017-06-20 ENCOUNTER — Encounter: Payer: Self-pay | Admitting: Genetic Counselor

## 2017-06-20 ENCOUNTER — Telehealth: Payer: Self-pay | Admitting: Genetic Counselor

## 2017-06-20 ENCOUNTER — Ambulatory Visit: Payer: Self-pay | Admitting: Genetic Counselor

## 2017-06-20 DIAGNOSIS — Z803 Family history of malignant neoplasm of breast: Secondary | ICD-10-CM

## 2017-06-20 DIAGNOSIS — D0511 Intraductal carcinoma in situ of right breast: Secondary | ICD-10-CM

## 2017-06-20 DIAGNOSIS — Z1379 Encounter for other screening for genetic and chromosomal anomalies: Secondary | ICD-10-CM

## 2017-06-20 DIAGNOSIS — Z800 Family history of malignant neoplasm of digestive organs: Secondary | ICD-10-CM

## 2017-06-20 NOTE — Telephone Encounter (Signed)
Revealed negative genetic testing.  Discussed that we do not know why she has breast cancer or why there is cancer in the family. It could be due to a different gene that we are not testing, or maybe our current technology may not be able to pick something up.  It will be important for her to keep in contact with genetics to keep up with whether additional testing may be needed.  Reported that there are 3 VUS.  This will not affect her management.

## 2017-06-20 NOTE — Progress Notes (Addendum)
HPI: [name] was previously seen in the [place] clinic due to a personal and family history of cancer and concerns regarding a hereditary predisposition to cancer. Please refer to our prior cancer genetics clinic note for more information regarding Rose [name]'s medical, social and family histories, and our assessment and recommendations, at the time. Rose [name] recent genetic test results were disclosed to her, as were recommendations warranted by these results. These results and recommendations are discussed in more detail below.  CANCER HISTORY:    Ductal carcinoma in situ (DCIS) of right breast   04/20/2017 Breast MRI    Right breast middle to posterior depth 1.3 x 0.8 x 0.9 cm suspicious mass spiculated mass, several 4-5 mm enhancing foci, multiple bilateral enhancing foci that are less than 5 mm      05/08/2017 Initial Diagnosis    Right breast biopsy 830 position: DCIS with calcifications, low-grade, ER 95%, PR 90%, Tis N0 stage 0; right breast 10:00:PASH      05/25/2017 -  Anti-estrogen oral therapy    Tamoxifen 20 mg daily      06/19/2017 Genetic Testing    BRIP1 c.3103C>T (p.Arg1035Cys), MSH6 c.2667G>T (p.Gln889His) and NBN c.1684G>A (p.Val562Ile) VUSs found on the comprehensive Common Cancer panel.  The Comprehensive Common Cancer Panel offered by [facility] includes sequencing and/or deletion duplication testing of the following 46 genes: APC, ATM, AXIN2, BAP1, BARD1, BMPR1A, BRCA1, BRCA2, BRIP1, CDH1, CDK4, CDKN2A, CHEK2, EPCAM, FANCC, FH, FLCN, HOXB13, MET, MITF,  MLH1, MSH2, MSH6, MUTYH, NBN, NF1, NTHL1,  PALB2, PMS2, POLD1, POLE, POT1, PTEN, RAD51C, RAD51D, RECQL, SCG5/GREM1, SDHB, SDHC, SDHD, SMAD4, STK11, TP53, TSC1, TSC2, and VHL.   The report date is June 19, 2017.        FAMILY HISTORY:  We obtained a detailed, 4-generation family history.  Significant diagnoses are listed below: Family History  Problem Relation Age of Onset  .  Breast cancer Maternal Aunt        70's  . Pancreatic cancer Maternal Aunt 63  . Colon cancer Other        PGF's sister    The patient does not have children.  She has one sister who is cancer free and does not have children.  Both parents are living.  The patient's mother has two sisters and a brother.  One sister had pancreatic cancer at 89 and the other sister had breast cancer at 52.  The sister with breast cancer is a twin of the brother, who has never had cancer.  Both maternal grandparents are deceased in their 59's-80's.  The patient's father has nine full siblings, 8 brothers and one sister, and three paternal half sisters.  None of his siblings have a diagnosis of cancer.  The patient's paternal grandparents are deceased.  The grandfather had a sister who had colon cancer in her 70's.  [name] is unaware of previous family history of genetic testing for hereditary cancer risks. Patient's maternal ancestors are of [place] and [place] descent, and paternal ancestors are of [place] American descent. There is no reported Ashkenazi Jewish ancestry. There is no known consanguinity.  GENETIC TEST RESULTS: Genetic testing reported out on June 19, 2017 through the Comprehensive Common cancer panel found no deleterious mutations.  The Comprehensive Common Cancer Panel offered by [facility] includes sequencing and/or deletion duplication testing of the following 46 genes: APC, ATM, AXIN2, BAP1, BARD1, BMPR1A, BRCA1, BRCA2, BRIP1, CDH1, CDK4, CDKN2A, CHEK2, EPCAM, FANCC, FH, FLCN, HOXB13, MET, MITF,  MLH1,  MSH2, MSH6, MUTYH, NBN, NF1, NTHL1,  PALB2, PMS2, POLD1, POLE, POT1, PTEN, RAD51C, RAD51D, RECQL, SCG5/GREM1, SDHB, SDHC, SDHD, SMAD4, STK11, TP53, TSC1, TSC2, and VHL.   The test report has been scanned into EPIC and is located under the Molecular Pathology section of the Results Review tab.   We discussed with Rose [name] that since the current genetic testing is not  perfect, it is possible there may be a gene mutation in one of these genes that current testing cannot detect, but that chance is small. We also discussed, that it is possible that another gene that has not yet been discovered, or that we have not yet tested, is responsible for the cancer diagnoses in the family, and it is, therefore, important to remain in touch with cancer genetics in the future so that we can continue to offer Rose [name] the most up to date genetic testing.   Genetic testing did detect three Variants of Unknown Significance - one in the BRIP1 gene called c.3103C>T (p.Arg1035Cys), a second in the MSH6 gene called c.2667G>T (p.Gln889His) and the third in the NBN gene called c.1684G>A (p.Val562Ile).  At this time, it is unknown if these variants are associated with increased cancer risk or if they are a normal finding, but most variants such as this get reclassified to being inconsequential. It should not be used to make medical management decisions. With time, we suspect the lab will determine the significance of this variant, if any. If we do learn more about it, we will try to contact Rose [name] to discuss it further. However, it is important to stay in touch with [place] periodically and keep the address and phone number up to date. Update: BRIP1 c.3103C>T VUS has been reclassified from a variant of uncertain significance to a likely benign variant based on a combination of sources, e.g., internal data, published literature, population databases and in [place].  The report date is January 19, 2018. MSH6 c.2667G>T VUS has been reclassified from a variant of uncertain significance to a likely benign variant based on a combination of sources, e.g., internal data, published literature, population databases and in [place].  The report date is August 26, 2019.     CANCER SCREENING RECOMMENDATIONS: This result is reassuring and indicates that Rose [name] likely does not  have an increased risk for a future cancer due to a mutation in one of these genes. This normal test also suggests that Rose [name]'s cancer was most likely not due to an inherited predisposition associated with one of these genes.  Most cancers happen by chance and this negative test suggests that her cancer falls into this category.  We, therefore, recommended she continue to follow the cancer management and screening guidelines provided by her oncology and primary healthcare provider.   RECOMMENDATIONS FOR FAMILY MEMBERS: Women in this family might be at some increased risk of developing cancer, over the general population risk, simply due to the family history of cancer. We recommended women in this family have a yearly mammogram beginning at age 26, or 8 years younger than the earliest onset of cancer, an annual clinical breast exam, and perform monthly breast self-exams. Women in this family should also have a gynecological exam as recommended by their primary provider. All family members should have a colonoscopy by age 7.  FOLLOW-UP: Lastly, we discussed with Ms. Boozer that cancer genetics is a rapidly advancing field and it is possible that new genetic tests will be appropriate for her and/or her  family members in the future. We encouraged her to remain in contact with cancer genetics on an annual basis so we can update her personal and family histories and let her know of advances in cancer genetics that may benefit this family.   Our contact number was provided. Ms. Lipinski's questions were answered to her satisfaction, and she knows she is welcome to call us at anytime with additional questions or concerns.   Roma Kayser, MS, [facility] Certified Genetic Counselor [email or web address]

## 2017-06-21 ENCOUNTER — Encounter (HOSPITAL_BASED_OUTPATIENT_CLINIC_OR_DEPARTMENT_OTHER): Payer: Self-pay | Admitting: *Deleted

## 2017-06-21 ENCOUNTER — Other Ambulatory Visit: Payer: Self-pay

## 2017-06-26 ENCOUNTER — Ambulatory Visit
Admission: RE | Admit: 2017-06-26 | Discharge: 2017-06-26 | Disposition: A | Discharge status: Home / Self Care | Payer: Managed Care, Other (non HMO) | Source: Ambulatory Visit | Attending: Surgery | Admitting: Surgery

## 2017-06-26 ENCOUNTER — Ambulatory Visit: Payer: Self-pay | Admitting: Surgery

## 2017-06-26 ENCOUNTER — Inpatient Hospital Stay: Admission: RE | Admit: 2017-06-26 | Payer: Managed Care, Other (non HMO) | Source: Ambulatory Visit

## 2017-06-26 DIAGNOSIS — N6310 Unspecified lump in the right breast, unspecified quadrant: Secondary | ICD-10-CM

## 2017-06-27 ENCOUNTER — Encounter (HOSPITAL_BASED_OUTPATIENT_CLINIC_OR_DEPARTMENT_OTHER): Payer: Self-pay | Admitting: *Deleted

## 2017-06-27 ENCOUNTER — Encounter (HOSPITAL_BASED_OUTPATIENT_CLINIC_OR_DEPARTMENT_OTHER)
Admission: RE | Disposition: A | Discharge status: Home / Self Care | Payer: Self-pay | Source: Ambulatory Visit | Attending: Surgery

## 2017-06-27 ENCOUNTER — Ambulatory Visit (HOSPITAL_BASED_OUTPATIENT_CLINIC_OR_DEPARTMENT_OTHER): Payer: Managed Care, Other (non HMO) | Admitting: Anesthesiology

## 2017-06-27 ENCOUNTER — Ambulatory Visit
Admission: RE | Admit: 2017-06-27 | Discharge: 2017-06-27 | Disposition: A | Discharge status: Home / Self Care | Payer: Managed Care, Other (non HMO) | Source: Ambulatory Visit | Attending: Surgery | Admitting: Surgery

## 2017-06-27 ENCOUNTER — Ambulatory Visit (HOSPITAL_BASED_OUTPATIENT_CLINIC_OR_DEPARTMENT_OTHER)
Admission: RE | Admit: 2017-06-27 | Discharge: 2017-06-27 | Disposition: A | Discharge status: Home / Self Care | Payer: Managed Care, Other (non HMO) | Source: Ambulatory Visit | Attending: Surgery | Admitting: Surgery

## 2017-06-27 DIAGNOSIS — Z800 Family history of malignant neoplasm of digestive organs: Secondary | ICD-10-CM | POA: Diagnosis not present

## 2017-06-27 DIAGNOSIS — D241 Benign neoplasm of right breast: Secondary | ICD-10-CM | POA: Diagnosis not present

## 2017-06-27 DIAGNOSIS — M069 Rheumatoid arthritis, unspecified: Secondary | ICD-10-CM | POA: Diagnosis not present

## 2017-06-27 DIAGNOSIS — D0511 Intraductal carcinoma in situ of right breast: Secondary | ICD-10-CM

## 2017-06-27 DIAGNOSIS — N6021 Fibroadenosis of right breast: Secondary | ICD-10-CM | POA: Diagnosis not present

## 2017-06-27 DIAGNOSIS — Z803 Family history of malignant neoplasm of breast: Secondary | ICD-10-CM | POA: Insufficient documentation

## 2017-06-27 DIAGNOSIS — Z79899 Other long term (current) drug therapy: Secondary | ICD-10-CM | POA: Insufficient documentation

## 2017-06-27 DIAGNOSIS — Z9889 Other specified postprocedural states: Secondary | ICD-10-CM | POA: Insufficient documentation

## 2017-06-27 DIAGNOSIS — Z7952 Long term (current) use of systemic steroids: Secondary | ICD-10-CM | POA: Insufficient documentation

## 2017-06-27 DIAGNOSIS — Z7951 Long term (current) use of inhaled steroids: Secondary | ICD-10-CM | POA: Insufficient documentation

## 2017-06-27 DIAGNOSIS — Z9049 Acquired absence of other specified parts of digestive tract: Secondary | ICD-10-CM | POA: Insufficient documentation

## 2017-06-27 HISTORY — DX: Other specified postprocedural states: R11.2

## 2017-06-27 HISTORY — PX: BREAST LUMPECTOMY WITH RADIOACTIVE SEED LOCALIZATION: SHX6424

## 2017-06-27 HISTORY — DX: Other specified postprocedural states: Z98.890

## 2017-06-27 LAB — CBC WITH DIFFERENTIAL/PLATELET
Basophils Absolute: 0 10*3/uL (ref 0.0–0.1)
Basophils Relative: 0 %
EOS PCT: 0 %
Eosinophils Absolute: 0 10*3/uL (ref 0.0–0.7)
HEMATOCRIT: 40.8 % (ref 36.0–46.0)
Hemoglobin: 13.4 g/dL (ref 12.0–15.0)
LYMPHS ABS: 5 10*3/uL — AB (ref 0.7–4.0)
LYMPHS PCT: 60 %
MCH: 29.4 pg (ref 26.0–34.0)
MCHC: 32.8 g/dL (ref 30.0–36.0)
MCV: 89.5 fL (ref 78.0–100.0)
MONO ABS: 0.4 10*3/uL (ref 0.1–1.0)
MONOS PCT: 5 %
NEUTROS ABS: 2.9 10*3/uL (ref 1.7–7.7)
Neutrophils Relative %: 35 %
PLATELETS: 207 10*3/uL (ref 150–400)
RBC: 4.56 MIL/uL (ref 3.87–5.11)
RDW: 13.8 % (ref 11.5–15.5)
WBC: 8.4 10*3/uL (ref 4.0–10.5)

## 2017-06-27 LAB — COMPREHENSIVE METABOLIC PANEL
ALT: 15 U/L (ref 14–54)
AST: 26 U/L (ref 15–41)
Albumin: 3.9 g/dL (ref 3.5–5.0)
Alkaline Phosphatase: 29 U/L — ABNORMAL LOW (ref 38–126)
Anion gap: 9 (ref 5–15)
BILIRUBIN TOTAL: 1 mg/dL (ref 0.3–1.2)
BUN: 9 mg/dL (ref 6–20)
CHLORIDE: 107 mmol/L (ref 101–111)
CO2: 24 mmol/L (ref 22–32)
CREATININE: 0.83 mg/dL (ref 0.44–1.00)
Calcium: 9.2 mg/dL (ref 8.9–10.3)
GFR calc Af Amer: >60 mL/min (ref >60)
Glucose, Bld: 93 mg/dL (ref 65–99)
Potassium: 3.4 mmol/L — ABNORMAL LOW (ref 3.5–5.1)
Sodium: 140 mmol/L (ref 135–145)
Total Protein: 7 g/dL (ref 6.5–8.1)

## 2017-06-27 SURGERY — BREAST LUMPECTOMY WITH RADIOACTIVE SEED LOCALIZATION
Anesthesia: General | Site: Breast | Laterality: Right

## 2017-06-27 MED ORDER — GABAPENTIN 300 MG PO CAPS
ORAL_CAPSULE | ORAL | Status: AC
  Filled 2017-06-27: qty 1

## 2017-06-27 MED ORDER — PROPOFOL 500 MG/50ML IV EMUL
INTRAVENOUS | Status: AC
  Filled 2017-06-27: qty 50

## 2017-06-27 MED ORDER — CELECOXIB 200 MG PO CAPS
ORAL_CAPSULE | ORAL | Status: AC
  Filled 2017-06-27: qty 1

## 2017-06-27 MED ORDER — KETOROLAC TROMETHAMINE 30 MG/ML IJ SOLN
INTRAMUSCULAR | Status: DC | PRN
  Administered 2017-06-27: 30 mg via INTRAVENOUS

## 2017-06-27 MED ORDER — IBUPROFEN 800 MG PO TABS: 800.0000 mg | ORAL_TABLET | Freq: Three times a day (TID) | ORAL | 0 refills | Status: DC | PRN

## 2017-06-27 MED ORDER — ACETAMINOPHEN 500 MG PO TABS
ORAL_TABLET | ORAL | Status: AC
  Filled 2017-06-27: qty 2

## 2017-06-27 MED ORDER — CHLORHEXIDINE GLUCONATE CLOTH 2 % EX PADS: 6.0000 | MEDICATED_PAD | Freq: Once | CUTANEOUS | Status: DC

## 2017-06-27 MED ORDER — METHYLENE BLUE 0.5 % INJ SOLN
INTRAVENOUS | Status: AC
  Filled 2017-06-27: qty 10

## 2017-06-27 MED ORDER — CEFAZOLIN SODIUM-DEXTROSE 2-4 GM/100ML-% IV SOLN
2.0000 g | Freq: Once | INTRAVENOUS | Status: AC
  Administered 2017-06-27: 2 g via INTRAVENOUS

## 2017-06-27 MED ORDER — FENTANYL CITRATE (PF) 100 MCG/2ML IJ SOLN
INTRAMUSCULAR | Status: DC | PRN
  Administered 2017-06-27: 100 ug via INTRAVENOUS

## 2017-06-27 MED ORDER — FENTANYL CITRATE (PF) 100 MCG/2ML IJ SOLN
INTRAMUSCULAR | Status: AC
  Filled 2017-06-27: qty 2

## 2017-06-27 MED ORDER — PROMETHAZINE HCL 25 MG/ML IJ SOLN: 6.2500 mg | INTRAMUSCULAR | Status: DC | PRN

## 2017-06-27 MED ORDER — DEXTROSE 5 % IV SOLN: 3.0000 g | INTRAVENOUS | Status: DC

## 2017-06-27 MED ORDER — MIDAZOLAM HCL 2 MG/2ML IJ SOLN: 1.0000 mg | INTRAMUSCULAR | Status: DC | PRN

## 2017-06-27 MED ORDER — ONDANSETRON HCL 4 MG/2ML IJ SOLN
INTRAMUSCULAR | Status: DC | PRN
  Administered 2017-06-27: 4 mg via INTRAVENOUS

## 2017-06-27 MED ORDER — FENTANYL CITRATE (PF) 100 MCG/2ML IJ SOLN: 25.0000 ug | INTRAMUSCULAR | Status: DC | PRN

## 2017-06-27 MED ORDER — BUPIVACAINE-EPINEPHRINE (PF) 0.25% -1:200000 IJ SOLN
INTRAMUSCULAR | Status: AC
  Filled 2017-06-27: qty 60

## 2017-06-27 MED ORDER — KETOROLAC TROMETHAMINE 30 MG/ML IJ SOLN: 30.0000 mg | Freq: Once | INTRAMUSCULAR | Status: DC | PRN

## 2017-06-27 MED ORDER — LIDOCAINE HCL (CARDIAC) 20 MG/ML IV SOLN
INTRAVENOUS | Status: DC | PRN
  Administered 2017-06-27: 60 mg via INTRAVENOUS

## 2017-06-27 MED ORDER — MIDAZOLAM HCL 2 MG/2ML IJ SOLN
INTRAMUSCULAR | Status: AC
  Filled 2017-06-27: qty 2

## 2017-06-27 MED ORDER — BUPIVACAINE-EPINEPHRINE (PF) 0.5% -1:200000 IJ SOLN
INTRAMUSCULAR | Status: AC
  Filled 2017-06-27: qty 30

## 2017-06-27 MED ORDER — LIDOCAINE 2% (20 MG/ML) 5 ML SYRINGE
INTRAMUSCULAR | Status: AC
  Filled 2017-06-27: qty 5

## 2017-06-27 MED ORDER — SODIUM CHLORIDE 0.9 % IJ SOLN
INTRAMUSCULAR | Status: AC
  Filled 2017-06-27: qty 10

## 2017-06-27 MED ORDER — GABAPENTIN 300 MG PO CAPS
300.0000 mg | ORAL_CAPSULE | ORAL | Status: AC
  Administered 2017-06-27: 300 mg via ORAL

## 2017-06-27 MED ORDER — ONDANSETRON HCL 4 MG/2ML IJ SOLN
INTRAMUSCULAR | Status: AC
  Filled 2017-06-27: qty 2

## 2017-06-27 MED ORDER — FENTANYL CITRATE (PF) 100 MCG/2ML IJ SOLN: 50.0000 ug | INTRAMUSCULAR | Status: DC | PRN

## 2017-06-27 MED ORDER — OXYCODONE HCL 5 MG PO TABS: 5.0000 mg | ORAL_TABLET | Freq: Four times a day (QID) | ORAL | 0 refills | Status: DC | PRN

## 2017-06-27 MED ORDER — LACTATED RINGERS IV SOLN
INTRAVENOUS | Status: DC
  Administered 2017-06-27: 07:00:00 via INTRAVENOUS

## 2017-06-27 MED ORDER — CEFAZOLIN SODIUM-DEXTROSE 2-4 GM/100ML-% IV SOLN
INTRAVENOUS | Status: AC
  Filled 2017-06-27: qty 100

## 2017-06-27 MED ORDER — ACETAMINOPHEN 500 MG PO TABS
1000.0000 mg | ORAL_TABLET | ORAL | Status: AC
  Administered 2017-06-27: 1000 mg via ORAL

## 2017-06-27 MED ORDER — CELECOXIB 200 MG PO CAPS
200.0000 mg | ORAL_CAPSULE | ORAL | Status: AC
  Administered 2017-06-27: 200 mg via ORAL

## 2017-06-27 MED ORDER — PROPOFOL 10 MG/ML IV BOLUS
INTRAVENOUS | Status: DC | PRN
  Administered 2017-06-27: 160 mg via INTRAVENOUS

## 2017-06-27 MED ORDER — SCOPOLAMINE 1 MG/3DAYS TD PT72: 1.0000 | MEDICATED_PATCH | Freq: Once | TRANSDERMAL | Status: DC | PRN

## 2017-06-27 MED ORDER — BUPIVACAINE-EPINEPHRINE (PF) 0.25% -1:200000 IJ SOLN
INTRAMUSCULAR | Status: DC | PRN
  Administered 2017-06-27 (×2): 10 mL

## 2017-06-27 MED ORDER — KETOROLAC TROMETHAMINE 30 MG/ML IJ SOLN
INTRAMUSCULAR | Status: AC
  Filled 2017-06-27: qty 1

## 2017-06-27 MED ORDER — MIDAZOLAM HCL 5 MG/5ML IJ SOLN
INTRAMUSCULAR | Status: DC | PRN
  Administered 2017-06-27: 2 mg via INTRAVENOUS

## 2017-06-27 SURGICAL SUPPLY — 52 items
ADH SKN CLS APL DERMABOND .7 (GAUZE/BANDAGES/DRESSINGS) ×1
APPLIER CLIP 9.375 MED OPEN (MISCELLANEOUS) ×2
APR CLP MED 9.3 20 MLT OPN (MISCELLANEOUS) ×1
BINDER BREAST LRG (GAUZE/BANDAGES/DRESSINGS) IMPLANT
BINDER BREAST MEDIUM (GAUZE/BANDAGES/DRESSINGS) ×1 IMPLANT
BINDER BREAST XLRG (GAUZE/BANDAGES/DRESSINGS) IMPLANT
BINDER BREAST XXLRG (GAUZE/BANDAGES/DRESSINGS) IMPLANT
BLADE SURG 15 STRL LF DISP TIS (BLADE) ×1 IMPLANT
BLADE SURG 15 STRL SS (BLADE) ×2
CANISTER SUC SOCK COL 7IN (MISCELLANEOUS) IMPLANT
CANISTER SUCT 1200ML W/VALVE (MISCELLANEOUS) IMPLANT
CHLORAPREP W/TINT 26ML (MISCELLANEOUS) ×2 IMPLANT
CLIP APPLIE 9.375 MED OPEN (MISCELLANEOUS) IMPLANT
COVER BACK TABLE 60X90IN (DRAPES) ×2 IMPLANT
COVER MAYO STAND STRL (DRAPES) ×2 IMPLANT
COVER PROBE W GEL 5X96 (DRAPES) ×2 IMPLANT
DECANTER SPIKE VIAL GLASS SM (MISCELLANEOUS) IMPLANT
DERMABOND ADVANCED (GAUZE/BANDAGES/DRESSINGS) ×1
DERMABOND ADVANCED .7 DNX12 (GAUZE/BANDAGES/DRESSINGS) ×1 IMPLANT
DEVICE DUBIN W/COMP PLATE 8390 (MISCELLANEOUS) ×2 IMPLANT
DRAPE LAPAROSCOPIC ABDOMINAL (DRAPES) ×1 IMPLANT
DRAPE LAPAROTOMY 100X72 PEDS (DRAPES) ×2 IMPLANT
DRAPE UTILITY XL STRL (DRAPES) ×2 IMPLANT
ELECT COATED BLADE 2.86 ST (ELECTRODE) ×2 IMPLANT
ELECT REM PT RETURN 9FT ADLT (ELECTROSURGICAL) ×2
ELECTRODE REM PT RTRN 9FT ADLT (ELECTROSURGICAL) ×1 IMPLANT
GLOVE BIOGEL PI IND STRL 7.0 (GLOVE) IMPLANT
GLOVE BIOGEL PI IND STRL 8 (GLOVE) ×1 IMPLANT
GLOVE BIOGEL PI INDICATOR 7.0 (GLOVE) ×2
GLOVE BIOGEL PI INDICATOR 8 (GLOVE) ×1
GLOVE ECLIPSE 6.5 STRL STRAW (GLOVE) ×1 IMPLANT
GLOVE ECLIPSE 8.0 STRL XLNG CF (GLOVE) ×3 IMPLANT
GOWN STRL REUS W/ TWL LRG LVL3 (GOWN DISPOSABLE) ×2 IMPLANT
GOWN STRL REUS W/TWL LRG LVL3 (GOWN DISPOSABLE) ×4
HEMOSTAT ARISTA ABSORB 3G PWDR (MISCELLANEOUS) ×1 IMPLANT
HEMOSTAT SNOW SURGICEL 2X4 (HEMOSTASIS) IMPLANT
KIT MARKER MARGIN INK (KITS) ×2 IMPLANT
NDL HYPO 25X1 1.5 SAFETY (NEEDLE) ×1 IMPLANT
NEEDLE HYPO 25X1 1.5 SAFETY (NEEDLE) ×2 IMPLANT
NS IRRIG 1000ML POUR BTL (IV SOLUTION) ×1 IMPLANT
PACK BASIN DAY SURGERY FS (CUSTOM PROCEDURE TRAY) ×2 IMPLANT
PENCIL BUTTON HOLSTER BLD 10FT (ELECTRODE) ×2 IMPLANT
SLEEVE SCD COMPRESS KNEE MED (MISCELLANEOUS) ×2 IMPLANT
SPONGE LAP 4X18 X RAY DECT (DISPOSABLE) ×2 IMPLANT
SUT MNCRL AB 4-0 PS2 18 (SUTURE) ×2 IMPLANT
SUT SILK 2 0 SH (SUTURE) IMPLANT
SUT VICRYL 3-0 CR8 SH (SUTURE) ×2 IMPLANT
SYR CONTROL 10ML LL (SYRINGE) ×2 IMPLANT
TOWEL OR 17X24 6PK STRL BLUE (TOWEL DISPOSABLE) ×2 IMPLANT
TOWEL OR NON WOVEN STRL DISP B (DISPOSABLE) ×2 IMPLANT
TUBE CONNECTING 20X1/4 (TUBING) IMPLANT
YANKAUER SUCT BULB TIP NO VENT (SUCTIONS) IMPLANT

## 2017-06-27 NOTE — Anesthesia Preprocedure Evaluation (Signed)
Anesthesia Evaluation  Patient identified by MRN, date of birth, ID band Patient awake    Reviewed: Allergy & Precautions, NPO status , Patient's Chart, lab work & pertinent test results  Airway Mallampati: II  TM Distance: >3 FB Neck ROM: Full    Dental no notable dental hx.    Pulmonary neg pulmonary ROS,    Pulmonary exam normal breath sounds clear to auscultation       Cardiovascular negative cardio ROS Normal cardiovascular exam Rhythm:Regular Rate:Normal     Neuro/Psych negative neurological ROS  negative psych ROS   GI/Hepatic negative GI ROS, Neg liver ROS,   Endo/Other  negative endocrine ROS  Renal/GU negative Renal ROS  negative genitourinary   Musculoskeletal  (+) Arthritis , Rheumatoid disorders,    Abdominal   Peds negative pediatric ROS (+)  Hematology negative hematology ROS (+)   Anesthesia Other Findings   Reproductive/Obstetrics negative OB ROS                             Anesthesia Physical Anesthesia Plan  ASA: II  Anesthesia Plan: General   Post-op Pain Management:    Induction: Intravenous  PONV Risk Score and Plan: 3 and Ondansetron, Dexamethasone, Treatment may vary due to age or medical condition and Midazolam  Airway Management Planned: LMA  Additional Equipment:   Intra-op Plan:   Post-operative Plan: Extubation in OR  Informed Consent: I have reviewed the patients History and Physical, chart, labs and discussed the procedure including the risks, benefits and alternatives for the proposed anesthesia with the patient or authorized representative who has indicated his/her understanding and acceptance.   Dental advisory given  Plan Discussed with: CRNA and Surgeon  Anesthesia Plan Comments:         Anesthesia Quick Evaluation

## 2017-06-27 NOTE — Discharge Instructions (Signed)
[facility] °Office Phone Number [phone] ° °BREAST BIOPSY/ PARTIAL MASTECTOMY: POST OP INSTRUCTIONS ° °Always review your discharge instruction sheet given to you by the facility where your surgery was performed. ° °IF YOU HAVE DISABILITY OR FAMILY LEAVE FORMS, YOU MUST BRING THEM TO THE OFFICE FOR PROCESSING.  DO NOT GIVE THEM TO YOUR DOCTOR. ° °1. A prescription for pain medication may be given to you upon discharge.  Take your pain medication as prescribed, if needed.  If narcotic pain medicine is not needed, then you may take acetaminophen (Tylenol) or ibuprofen (Advil) as needed. °2. Take your usually prescribed medications unless otherwise directed °3. If you need a refill on your pain medication, please contact your pharmacy.  They will contact our office to request authorization.  Prescriptions will not be filled after 5pm or on week-ends. °4. You should eat very light the first 24 hours after surgery, such as soup, crackers, pudding, etc.  Resume your normal diet the day after surgery. °5. Most patients will experience some swelling and bruising in the breast.  Ice packs and a good support bra will help.  Swelling and bruising can take several days to resolve.  °6. It is common to experience some constipation if taking pain medication after surgery.  Increasing fluid intake and taking a stool softener will usually help or prevent this problem from occurring.  A mild laxative (Milk of Magnesia or Miralax) should be taken according to package directions if there are no bowel movements after 48 hours. °7. Unless discharge instructions indicate otherwise, you may remove your bandages 24-48 hours after surgery, and you may shower at that time.  You may have steri-strips (small skin tapes) in place directly over the incision.  These strips should be left on the skin for 7-10 days.  If your surgeon used skin glue on the incision, you may shower in 24 hours.  The glue will flake off over the  next 2-3 weeks.  Any sutures or staples will be removed at the office during your follow-up visit. °8. ACTIVITIES:  You may resume regular daily activities (gradually increasing) beginning the next day.  Wearing a good support bra or sports bra minimizes pain and swelling.  You may have sexual intercourse when it is comfortable. °a. You may drive when you no longer are taking prescription pain medication, you can comfortably wear a seatbelt, and you can safely maneuver your car and apply brakes. °b. RETURN TO WORK:  ______________________________________________________________________________________ °9. You should see your doctor in the office for a follow-up appointment approximately two weeks after your surgery.  Your doctor’s nurse will typically make your follow-up appointment when she calls you with your pathology report.  Expect your pathology report 2-3 business days after your surgery.  You may call to check if you do not hear from us after three days. °10. OTHER INSTRUCTIONS: _______________________________________________________________________________________________ _____________________________________________________________________________________________________________________________________ °_____________________________________________________________________________________________________________________________________ °_____________________________________________________________________________________________________________________________________ ° °WHEN TO CALL YOUR DOCTOR: °1. Fever over 101.0 °2. Nausea and/or vomiting. °3. Extreme swelling or bruising. °4. Continued bleeding from incision. °5. Increased pain, redness, or drainage from the incision. ° °The clinic staff is available to answer your questions during regular business hours.  Please don’t hesitate to call and ask to speak to one of the nurses for clinical concerns.  If you have a medical emergency, go to the nearest  emergency room or call 911.  A surgeon from [facility] is always on call at the hospital. ° °For further questions, please visit [email or web address]  ° ° ° ° °  Post Anesthesia Home Care Instructions ° °Activity: °Get plenty of rest for the remainder of the day. A responsible individual must stay with you for 24 hours following the procedure.  °For the next 24 hours, DO NOT: °-Drive a car °-Operate machinery °-Drink alcoholic beverages °-Take any medication unless instructed by your physician °-Make any legal decisions or sign important papers. ° °Meals: °Start with liquid foods such as gelatin or soup. Progress to regular foods as tolerated. Avoid greasy, spicy, heavy foods. If nausea and/or vomiting occur, drink only clear liquids until the nausea and/or vomiting subsides. Call your physician if vomiting continues. ° °Special Instructions/Symptoms: °Your throat may feel dry or sore from the anesthesia or the breathing tube placed in your throat during surgery. If this causes discomfort, gargle with warm salt water. The discomfort should disappear within 24 hours. ° °If you had a scopolamine patch placed behind your ear for the management of post- operative nausea and/or vomiting: ° °1. The medication in the patch is effective for 72 hours, after which it should be removed.  Wrap patch in a tissue and discard in the trash. Wash hands thoroughly with soap and water. °2. You may remove the patch earlier than 72 hours if you experience unpleasant side effects which may include dry mouth, dizziness or visual disturbances. °3. Avoid touching the patch. Wash your hands with soap and water after contact with the patch. °  ° °

## 2017-06-27 NOTE — H&P (Addendum)
Signed             Rose [name]  Location: [facility] Patient #: [id number] DOB: 28-Feb-1971 Married / Language: English / Race: Black or African American Female  History of Present Illness Rose [name] A. 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 She underwent biopsy of other suspicious areas were which were benign. She is ready for surgery and was to proceed with right breast seed localized lumpectomy.     ADDITIONAL INFORMATION: 1. PROGNOSTIC INDICATORS Results: IMMUNOHISTOCHEMICAL AND MORPHOMETRIC ANALYSIS PERFORMED MANUALLY Estrogen Receptor: 95%, POSITIVE, STRONG STAINING INTENSITY Progesterone Receptor: 90%, POSITIVE, STRONG STAINING INTENSITY REFERENCE RANGE ESTROGEN RECEPTOR NEGATIVE 0% POSITIVE =>1% REFERENCE RANGE PROGESTERONE RECEPTOR NEGATIVE 0% POSITIVE =>1% All controls stained appropriately Rose Cutter MD Pathologist, Electronic Signature ( Signed 05/16/2017) FINAL DIAGNOSIS Diagnosis 1. Breast, right, needle core biopsy, 8:30 o'clock, cylinder marker - DUCTAL CARCINOMA IN SITU WITH CALCIFICATIONS. - SEE COMMENT. 2. Breast, right, needle core biopsy, 10:00 o'clock, dumbbell marker - PSEUDOANGIOMATOUS STROMAL HYPERPLASIA. - FIBROCYSTIC CHANGES WITH ADENOSIS AND CALCIFICATIONS. - THERE IS NO EVIDENCE OF MALIGNANCY. 1 of 3 FINAL for Rose [name], [name] Microscopic Comment 1. Immunohistochemical stains for calponin, smooth muscle myosin, and p63 high            CLINICAL DATA: 46 year old female presenting for MRI given history of spontaneous right nipple discharge from 1 duct which occurred for 1 week in early September. She also had a recent biopsy on March 20, 2017 with pathology results demonstrating fibrocystic change of the right breast.  LABS: Not applicable.  EXAM: BILATERAL BREAST MRI WITH AND WITHOUT  CONTRAST  TECHNIQUE: Multiplanar, multisequence MR images of both breasts were obtained prior to and following the intravenous administration of 12 ml of MultiHance.  THREE-DIMENSIONAL MR IMAGE RENDERING ON INDEPENDENT WORKSTATION:  Three-dimensional MR images were rendered by post-processing of the original MR data on an independent workstation. The three-dimensional MR images were interpreted, and findings are reported in the following complete MRI report for this study. Three dimensional images were evaluated at the independent DynaCad workstation  COMPARISON: No prior MRI available for comparison. Correlation made with prior mammograms and ultrasounds.  FINDINGS: Breast composition: d. Extreme fibroglandular tissue.  Background parenchymal enhancement: Marked  Right breast:  In the superficial lateral aspect of the right breast in the middle to posterior depth there is a suspicious 1.3 x 0.8 x 0.9 cm spiculated mass (series 7, image 130).  In addition to this, there are several 4-5 mm enhancing foci with demonstrate washout kinetics. The largest an most intensely enhancing of these (most suspicious) is in the upper slightly outer aspect of the right breast, middle depth (series 7, image 114), and measures 5 mm. Two others are seen an series 7, image 140 in the lower outer quadrant posterior depth, and in image 100 in the upper-outer quadrant middle to anterior depth). There are however multiple bilateral enhancing foci which are less than 5 mm decreasing sensitivity of the MRI.  The biopsy site from the benign biopsy is seen in the lower-inner quadrant of the right breast associated with susceptibility from the biopsy marking clip.  Left breast: No mass or abnormal enhancement.  Lymph nodes: No abnormal appearing lymph nodes.  Ancillary findings: None.  IMPRESSION: 1. Marked background parenchymal enhancement with multiple bilateral enhancing  foci limits sensitivity of this MRI.  2. There is a suspicious spiculated 1.3 cm mass in the lateral right breast, middle depth.  3. There are a few enhancing foci in the lateral aspect of the right breast with washout kinetics. The most suspicious of these is in the slightly upper outer right breast and measures 5 mm.  4. No MRI evidence of malignancy in the left breast.  RECOMMENDATION: 1. Second-look ultrasound is recommended for the spiculated right breast mass, and for satellite lesions in the lateral right breast. If either is not seen by ultrasound, MRI biopsy is recommended. If MRI biopsy is done for one of the smaller lesions, the possible satellite lesion in the upper outer right breast (series 7, image 114), should be performed.  2. Right axillary ultrasound should be done at the time of the 2nd look to document lymph node status.  BI-RADS CATEGORY 5: Highly suggestive of malignancy.   Electronically Signed By: Rose [name] M.D. On: 04/21/2017 12:40                 Diagnosis Breast, right, needle core biopsy, upper central - FIBROCYSTIC CHANGE WITH CALCIFICATIONS - USUAL DUCTAL HYPERPLASIA. - NO MALIGNANCY IDENTIFIED. Microscopic Comment The case was called to [facility] on 05/18/2017. Rose Males MD Pathologist, Electronic Signature (Case signed 05/18/2017) Specimen.  The patient is a 46 year old female.   Allergies No Known Drug Allergies Allergies Reconciled  Medication History Orencia (125MG /ML Solution, Subcutaneous) Active. Tylenol 8 Hour (650MG  Tablet ER, Oral) Active. Aczone (5% Gel, External) Active. Fish Oil-Vitamin D (1000-1000MG -UNIT Capsule, Oral) Active. Prenatal (Oral) Active. Multiple Vitamin (Oral) Active. Medications Reconciled  ROS: NEGATIVE   Vitals (Rose [name] RMA; Weight: 139.8 lb Height: 60in Body Surface Area: 1.6 m Body Mass  Index: 27.3 kg/m  Temp.: 98.59F  Pulse: 105 (Regular)  BP: 136/82 (Sitting, Left Arm, Standard)      Physical Exam  General Mental Status-Alert. General Appearance-Consistent with stated age. Hydration-Well hydrated. Voice-Normal.  Breast Note: not re EXAMINED TODAY  Neurologic Neurologic evaluation reveals -alert and oriented x 3 with no impairment of recent or remote memory. Mental Status-Normal.  Musculoskeletal Normal Exam - Left-Upper Extremity Strength Normal and Lower Extremity Strength Normal. Normal Exam - Right-Upper Extremity Strength Normal and Lower Extremity Strength Normal.    Assessment & Plan (Rose Flanery A. Darlina Mccaughey MD; BREAST NEOPLASM, TIS (DCIS), RIGHT (D05.11) Impression: PT HAS OPTED FOR RIGHT BREAST LUMPECTOMY  NEEDS TO SEE ONCOLOGY AND GENETICS NEEDS TO SEE RADIATION ONCOLOGY   Risk of lumpectomy include bleeding, infection, seroma, more surgery, use of seed/wire, wound care, cosmetic deformity and the need for other treatments, death , blood clots, death. Pt agrees to proceed.  Current Plans We discussed the staging and pathophysiology of breast cancer. We discussed all of the different options for treatment for breast cancer including surgery, chemotherapy, radiation therapy, Herceptin, and antiestrogen therapy. We discussed a sentinel lymph node biopsy as she does not appear to having lymph node involvement right now. We discussed the performance of that with injection of radioactive tracer and blue dye. We discussed that she would have an incision underneath her axillary hairline. We discussed that there is a bout a 10-20% chance of having a positive node with a sentinel lymph node biopsy and we will await the permanent pathology to make any other first further decisions in terms of her treatment. One of these options might be to return to the operating room to perform an axillary lymph node dissection. We  discussed  about a 1-2% risk lifetime of chronic shoulder pain as well as lymphedema associated with a sentinel lymph node biopsy. We discussed the options for treatment of the breast cancer which included lumpectomy versus a mastectomy. We discussed the performance of the lumpectomy with a wire placement. We discussed a 10-20% chance of a positive margin requiring reexcision in the operating room. We also discussed that she may need radiation therapy or antiestrogen therapy or both if she undergoes lumpectomy. We discussed the mastectomy and the postoperative care for that as well. We discussed that there is no difference in her survival whether she undergoes lumpectomy with radiation therapy or antiestrogen therapy versus a mastectomy. There is a slight difference in the local recurrence rate being 3-5% with lumpectomy and about 1% with a mastectomy. We discussed the risks of operation including bleeding, infection, possible reoperation. She understands her further therapy will be based on what her stages at the time of her operation.  Pt Education - flb breast cancer surgery: discussed with patient and provided information. Pt Education - ABC (After Breast Cancer) Class Info: discussed with patient and provided information.

## 2017-06-27 NOTE — Anesthesia Postprocedure Evaluation (Signed)
Anesthesia Post Note  Patient: Rose [name]  Procedure(s) Performed: RIGHT BREAST LUMPECTOMY WITH RADIOACTIVE SEED LOCALIZATION (Right Breast)     Patient location during evaluation: PACU Anesthesia Type: General Level of consciousness: awake and alert Pain management: pain level controlled Vital Signs Assessment: post-procedure vital signs reviewed and stable Respiratory status: spontaneous breathing, nonlabored ventilation, respiratory function stable and patient connected to nasal cannula oxygen Cardiovascular status: blood pressure returned to baseline and stable Postop Assessment: no apparent nausea or vomiting Anesthetic complications: no    Last Vitals:  Vitals:   06/27/17 0846 06/27/17 0900  BP:  (!) 127/97  Pulse: 80 82  Resp: 17 15  Temp:    SpO2: 100% 100%    Last Pain:  Vitals:   06/27/17 0900  TempSrc:   PainSc: 0-No pain                 Niel Peretti S

## 2017-06-27 NOTE — Anesthesia Procedure Notes (Signed)
Procedure Name: LMA Insertion Date/Time: 06/27/2017 7:41 AM Performed by: Jonna Munro, CRNA Pre-anesthesia Checklist: Patient identified, Emergency Drugs available, Suction available, Patient being monitored and Timeout performed Patient Re-evaluated:Patient Re-evaluated prior to induction Oxygen Delivery Method: Circle system utilized Preoxygenation: Pre-oxygenation with 100% oxygen Induction Type: IV induction LMA: LMA inserted LMA Size: 4.0 Number of attempts: 1 Placement Confirmation: positive ETCO2 and breath sounds checked- equal and bilateral Tube secured with: Tape Dental Injury: Teeth and Oropharynx as per pre-operative assessment

## 2017-06-27 NOTE — Interval H&P Note (Signed)
History and Physical Interval Note:  06/27/2017 7:19 AM  Rose [name]  has presented today for surgery, with the diagnosis of RIGHT BREAST DCIS  The various methods of treatment have been discussed with the patient and family. After consideration of risks, benefits and other options for treatment, the patient has consented to  Procedure(s): RIGHT BREAST LUMPECTOMY WITH RADIOACTIVE SEED LOCALIZATION (Right) as a surgical intervention .  The patient's history has been reviewed, patient examined, no change in status, stable for surgery.  I have reviewed the patient's chart and labs.  Questions were answered to the patient's satisfaction.     [place]

## 2017-06-27 NOTE — Transfer of Care (Signed)
Immediate Anesthesia Transfer of Care Note  Patient: Rose [name]  Procedure(s) Performed: RIGHT BREAST LUMPECTOMY WITH RADIOACTIVE SEED LOCALIZATION (Right Breast)  Patient Location: PACU  Anesthesia Type:General  Level of Consciousness: awake, alert  and oriented  Airway & Oxygen Therapy: Patient Spontanous Breathing and Patient connected to face mask oxygen  Post-op Assessment: Report given to RN and Post -op Vital signs reviewed and stable  Post vital signs: Reviewed and stable  Last Vitals:  Vitals:   06/27/17 0639  BP: 131/83  Pulse: 69  Resp: 18  Temp: 36.5 C  SpO2: 100%    Last Pain:  Vitals:   06/27/17 0639  TempSrc: Oral      Patients Stated Pain Goal: 0 (44/62/86 3817)  Complications: No apparent anesthesia complications

## 2017-06-27 NOTE — H&P (Signed)
Rose [name] is an 46 y.o. female.   Chief Complaint: DCIS right breast  HPI: pt presents for lumpectomy for right DCIS   Past Medical History:  Diagnosis Date  . Abdominal pain   . Breast cancer ([place]) 05/17/2017   Right breast  . Chronic rheumatic arthritis ([place])   . Family history of breast cancer   . Family history of colon cancer   . Family history of pancreatic cancer   . Malignant neoplasm of right breast in female, estrogen receptor positive ([place]) 05/25/2017  . N&V (nausea and vomiting)   . PONV (postoperative nausea and vomiting)   . Poor appetite     Past Surgical History:  Procedure Laterality Date  . APPENDECTOMY  4/12  . COLPOSCOPY  1999  . HYSTEROSCOPY WITH RESECTOSCOPE  11/10  . MEDIAL COLLATERAL LIGAMENT REPAIR, KNEE Left 05/04/2017   Procedure: LEFT THUMB  COLLATERAL LIGAMENT REPAIR;  Surgeon: Leanora Cover, MD;  Location: [facility];  Service: Orthopedics;  Laterality: Left;    Family History  Problem Relation Age of Onset  . Breast cancer Maternal Aunt        70's  . Pancreatic cancer Maternal Aunt 63  . Colon cancer Other        PGF's sister   Social History:  reports that  has never smoked. she has never used smokeless tobacco. She reports that she drinks about 3.0 - 3.6 oz of alcohol per week. She reports that she does not use drugs.  Allergies: No Known Allergies  Medications Prior to Admission  Medication Sig Dispense Refill  . acetaminophen (TYLENOL) 500 MG tablet Take 650 mg by mouth every 6 (six) hours as needed.     . Dapsone (ACZONE) 5 % topical gel Apply topically 2 (two) times daily.    . fish oil-omega-3 fatty acids 1000 MG capsule Take 1,000 mg by mouth 2 (two) times daily.      . fluticasone (FLONASE) 50 MCG/ACT nasal spray USE 2 SPRAYS IN EACH NOSTRIL ONCE DAILY AS NEEDED 30 DAYS  6  . HYDROcodone-acetaminophen (NORCO) 5-325 MG tablet 1-2 tabs po q6 hours prn pain 20 tablet 0  . hydroxychloroquine (PLAQUENIL) 200  MG tablet Take by mouth 2 (two) times daily.      . Multiple Vitamin (ONCE DAILY PO) Take by mouth 1 dose over 24 hours.      . predniSONE (DELTASONE) 10 MG tablet Take 10 mg by mouth daily with breakfast.    . Prenatal Vit-Fe Fumarate-FA (PNV PRENATAL PLUS MULTIVITAMIN) 27-1 MG TABS TAKE 1 TABLET EVERY DAY 30 tablet 11  . Abatacept (ORENCIA) 125 MG/ML SOLN Inject into the skin. Every 4 weeks    . clindamycin-tretinoin (ZIANA) gel Apply topically daily.      . methocarbamol (ROBAXIN) 500 MG tablet Take 1 tablet (500 mg total) by mouth at bedtime as needed for muscle spasms. 12 tablet 0  . tamoxifen (NOLVADEX) 20 MG tablet Take 1 tablet (20 mg total) daily by mouth. 90 tablet 3  . valACYclovir (VALTREX) 1000 MG tablet TAKE 1/2 TABLET BY MOUTH DAILY 30 tablet 2    No results found for this or any previous visit (from the past 48 hour(s)). Mm Rt Radioactive Seed Loc Mammo Guide  Result Date: 06/26/2017 CLINICAL DATA:  Biopsy proven DCIS in the upper-outer quadrant of the right breast (cylindrical shaped clip). EXAM: MAMMOGRAPHIC GUIDED RADIOACTIVE SEED LOCALIZATION OF THE RIGHT BREAST COMPARISON:  Previous exam(s). FINDINGS: Patient presents for radioactive seed  localization prior to surgery. I met with the patient and we discussed the procedure of seed localization including benefits and alternatives. We discussed the high likelihood of a successful procedure. We discussed the risks of the procedure including infection, bleeding, tissue injury and further surgery. We discussed the low dose of radioactivity involved in the procedure. Informed, written consent was given. The usual time-out protocol was performed immediately prior to the procedure. Using mammographic guidance, sterile technique, 1% lidocaine and an I-125 radioactive seed, the cylindrical shaped clip was localized using a lateral to medial approach. The follow-up mammogram images confirm the seed in the expected location and were marked for  Dr. Brantley Stage. Follow-up survey of the patient confirms presence of the radioactive seed. Order number of I-125 seed:  [id number]. Total activity:  4.709 millicuries  Reference Date: 06/06/2017 The patient tolerated the procedure well and was released from the [facility]. She was given instructions regarding seed removal. IMPRESSION: Radioactive seed localization right breast. No apparent complications. Electronically Signed   By: Lillia Mountain M.D.   On: 06/26/2017 14:36    Review of Systems  Constitutional: Negative.   HENT: Negative.   Eyes: Negative.   Respiratory: Negative.   Cardiovascular: Negative.   Gastrointestinal: Negative.   Genitourinary: Negative.   Musculoskeletal: Negative.     Blood pressure 131/83, pulse 69, temperature 97.7 F (36.5 C), temperature source Oral, resp. rate 18, height 5' (1.524 m), weight 61.3 kg (135 lb 4 oz), last menstrual period 06/17/2017, SpO2 100 %. Physical Exam  Constitutional: She is oriented to person, place, and time. She appears well-developed and well-nourished.  HENT:  Head: Normocephalic and atraumatic.  Eyes: Pupils are equal, round, and reactive to light.  Neck: Normal range of motion. Neck supple.  Cardiovascular: Normal rate and regular rhythm.  Respiratory: Effort normal and breath sounds normal. Right breast exhibits nipple discharge. Right breast exhibits no inverted nipple and no mass. Left breast exhibits no inverted nipple, no mass and no nipple discharge.  GI: Soft. Bowel sounds are normal.  Neurological: She is alert and oriented to person, place, and time.  Skin: Skin is warm and dry.  Psychiatric: She has a normal mood and affect. Thought content normal.     Assessment/Plan RIGHT BREAST DCIS  Right lumpectomy  The procedure has been discussed with the patient. Alternatives to surgery have been discussed with the patient.  Risks of surgery include bleeding,  Infection,  Seroma formation, death,  and the need for further  surgery.   The patient understands and wishes to proceed.  Turner Daniels, MD 06/27/2017, 7:20 AM

## 2017-06-27 NOTE — Op Note (Signed)
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  Risk, benefits and other options of treatment were discussed with the patient.The procedure has been discussed with the patient. Alternatives to surgery have been discussed with the patient.  Risks of surgery include bleeding,  Cosmesis,  Infection,  Seroma formation, death,  and the need for further surgery.   The patient understands and wishes to proceed.  Description of procedure: The patient was met in the holding area and the right breast was marked as the correct side.  Seed placement was done as an outpatient per radiology.  Questions were answered.  She was taken back to the operating room and placed supine on the OR table.  After induction of general anesthesia, the right breast was prepped and draped in sterile fashion and timeout was done.  Her x-rays were available for review during the case.  Neoprobe was used to identify the seed in the right breast upper outer quadrant.  Curvilinear incision was made in the right breast upper outer quadrant.  Dissection was carried down around the seed and clip using the help of the neoprobe.  All tissue was excised with a grossly negative margin.  Additional lateral margin was taken after clip radiograph was reviewed.  This was sent separately.  The cavity was made hemostatic with cautery and 3-0 Vicryl. Arista was used as well.  Hemostasis was achieved.  The cavity was closed with 3-0 Vicryl and 4-0 Monocryl.  Dermabond applied.  All final counts found to be correct.  Patient was awoke extubated taken to  recovery in satisfactory condition.

## 2017-06-28 ENCOUNTER — Encounter (HOSPITAL_BASED_OUTPATIENT_CLINIC_OR_DEPARTMENT_OTHER): Payer: Self-pay | Admitting: Surgery

## 2017-07-11 NOTE — Assessment & Plan Note (Deleted)
06/27/2017: Rt Lumpectomy: DCIS grade 1 spanning 0.3 cm, margins Neg, Intra ductal papilloma, UDH, CSL, Tis Nx Stage 0, ER 95%, PR: 90% Started Tamoxifen pre-operatively  Pathology counseling: I discussed the final pathology report of the patient provided  a copy of this report. I discussed the margins as well as lymph node surgeries. We also discussed the final staging along with previously performed ER/PR testing.  Plan: 1. Adj XT 2. Tamoxifen for 5 years  RTC after XRT is complete

## 2017-07-12 ENCOUNTER — Ambulatory Visit: Payer: Managed Care, Other (non HMO) | Admitting: Hematology and Oncology

## 2017-07-18 ENCOUNTER — Telehealth: Payer: Self-pay

## 2017-07-18 NOTE — Telephone Encounter (Signed)
Received VM from pt regarding she missed appt on 07-12-2017 and would like to reschedule. Sent message to Scheduling to contact pt to reschedule her for next available appt and to contact pt with that time/date. Notified pt to be expecting their call. No further needs at this time.

## 2017-07-19 ENCOUNTER — Telehealth: Payer: Self-pay | Admitting: Hematology and Oncology

## 2017-07-19 ENCOUNTER — Ambulatory Visit: Payer: Managed Care, Other (non HMO) | Admitting: Radiation Oncology

## 2017-07-19 ENCOUNTER — Ambulatory Visit: Payer: Managed Care, Other (non HMO)

## 2017-07-19 NOTE — Telephone Encounter (Signed)
Scheduled appt per 1/8 sch msg - left voicemail for patient regarding appts that were added.

## 2017-07-24 ENCOUNTER — Other Ambulatory Visit: Payer: Self-pay

## 2017-07-24 ENCOUNTER — Inpatient Hospital Stay: Payer: Managed Care, Other (non HMO) | Attending: Hematology and Oncology | Admitting: Hematology and Oncology

## 2017-07-24 DIAGNOSIS — D0511 Intraductal carcinoma in situ of right breast: Secondary | ICD-10-CM

## 2017-07-24 NOTE — Progress Notes (Signed)
Patient Care Team: Merrilee Seashore, MD as PCP - General (Internal Medicine)  DIAGNOSIS:  Encounter Diagnosis  Name Primary?  . Ductal carcinoma in situ (DCIS) of right breast     SUMMARY OF ONCOLOGIC HISTORY:   Ductal carcinoma in situ (DCIS) of right breast   04/20/2017 Breast MRI    Right breast middle to posterior depth 1.3 x 0.8 x 0.9 cm suspicious mass spiculated mass, several 4-5 mm enhancing foci, multiple bilateral enhancing foci that are less than 5 mm      05/08/2017 Initial Diagnosis    Right breast biopsy 830 position: DCIS with calcifications, low-grade, ER 95%, PR 90%, Tis N0 stage 0; right breast 10:00:PASH      05/25/2017 -  Anti-estrogen oral therapy    Tamoxifen 20 mg daily      06/19/2017 Genetic Testing    BRIP1 c.3103C>T (p.Arg1035Cys), MSH6 c.2667G>T (p.Gln889His) and NBN c.1684G>A (p.Val562Ile) VUSs found on the comprehensive Common Cancer panel.  The Comprehensive Common Cancer Panel offered by [facility] includes sequencing and/or deletion duplication testing of the following 46 genes: APC, ATM, AXIN2, BAP1, BARD1, BMPR1A, BRCA1, BRCA2, BRIP1, CDH1, CDK4, CDKN2A, CHEK2, EPCAM, FANCC, FH, FLCN, HOXB13, MET, MITF,  MLH1, MSH2, MSH6, MUTYH, NBN, NF1, NTHL1,  PALB2, PMS2, POLD1, POLE, POT1, PTEN, RAD51C, RAD51D, RECQL, SCG5/GREM1, SDHB, SDHC, SDHD, SMAD4, STK11, TP53, TSC1, TSC2, and VHL.   The report date is June 19, 2017.       06/27/2017 Surgery    Rt Lumpectomy: DCIS grade 1 spanning 0.3 cm, margins Neg, Intra ductal papilloma, UDH, CSL, Tis Nx Stage 0       CHIEF COMPLIANT: Follow-up after right lumpectomy for DCIS  INTERVAL HISTORY: Rose [name] is a 47 year old with above-mentioned history of right breast DCIS who underwent lumpectomy and is here today to discuss the pathology report.  Her biggest issue is a rash that is present on the lateral aspect of the right breast including the right shoulder.  She was put on topical  triamcinolone but it has not helped her.  It continues to itch and cause her discomfort.  REVIEW OF SYSTEMS:   Constitutional: Denies fevers, chills or abnormal weight loss Eyes: Denies blurriness of vision Ears, nose, mouth, throat, and face: Denies mucositis or sore throat Respiratory: Denies cough, dyspnea or wheezes Cardiovascular: Denies palpitation, chest discomfort Gastrointestinal:  Denies nausea, heartburn or change in bowel habits Skin: Denies abnormal skin rashes Lymphatics: Denies new lymphadenopathy or easy bruising Neurological:Denies numbness, tingling or new weaknesses Behavioral/Psych: Mood is stable, no new changes  Extremities: No lower extremity edema Breast:   rash on the lateral aspect of the right breast and the shoulder All other systems were reviewed with the patient and are negative.  I have reviewed the past medical history, past surgical history, social history and family history with the patient and they are unchanged from previous note.  ALLERGIES:  has No Known Allergies.  MEDICATIONS:  Current Outpatient Medications  Medication Sig Dispense Refill  . Abatacept (ORENCIA) 125 MG/ML SOLN Inject into the skin. Every 4 weeks    . acetaminophen (TYLENOL) 500 MG tablet Take 650 mg by mouth every 6 (six) hours as needed.     . clindamycin-tretinoin (ZIANA) gel Apply topically daily.      . Dapsone (ACZONE) 5 % topical gel Apply topically 2 (two) times daily.    . fish oil-omega-3 fatty acids 1000 MG capsule Take 1,000 mg by mouth 2 (two) times daily.      Marland Kitchen  fluticasone (FLONASE) 50 MCG/ACT nasal spray USE 2 SPRAYS IN EACH NOSTRIL ONCE DAILY AS NEEDED 30 DAYS  6  . HYDROcodone-acetaminophen (NORCO) 5-325 MG tablet 1-2 tabs po q6 hours prn pain 20 tablet 0  . hydroxychloroquine (PLAQUENIL) 200 MG tablet Take by mouth 2 (two) times daily.      . ibuprofen (ADVIL,MOTRIN) 800 MG tablet Take 1 tablet (800 mg total) by mouth every 8 (eight) hours as needed. 30 tablet  0  . methocarbamol (ROBAXIN) 500 MG tablet Take 1 tablet (500 mg total) by mouth at bedtime as needed for muscle spasms. 12 tablet 0  . Multiple Vitamin (ONCE DAILY PO) Take by mouth 1 dose over 24 hours.      . oxyCODONE (OXY IR/ROXICODONE) 5 MG immediate release tablet Take 1-2 tablets (5-10 mg total) by mouth every 6 (six) hours as needed for severe pain. 12 tablet 0  . predniSONE (DELTASONE) 10 MG tablet Take 10 mg by mouth daily with breakfast.    . Prenatal Vit-Fe Fumarate-FA (PNV PRENATAL PLUS MULTIVITAMIN) 27-1 MG TABS TAKE 1 TABLET EVERY DAY 30 tablet 11  . tamoxifen (NOLVADEX) 20 MG tablet Take 1 tablet (20 mg total) daily by mouth. 90 tablet 3  . valACYclovir (VALTREX) 1000 MG tablet TAKE 1/2 TABLET BY MOUTH DAILY 30 tablet 2   No current facility-administered medications for this visit.     PHYSICAL EXAMINATION: ECOG PERFORMANCE STATUS: 1 - Symptomatic but completely ambulatory  Vitals:   07/24/17 1100  BP: (!) 140/92  Pulse: 91  Resp: 18  Temp: 98.5 F (36.9 C)  SpO2: 100%   Filed Weights   07/24/17 1100  Weight: 135 lb 12.8 oz (61.6 kg)    GENERAL:alert, no distress and comfortable SKIN: skin color, texture, turgor are normal, no rashes or significant lesions EYES: normal, Conjunctiva are pink and non-injected, sclera clear OROPHARYNX:no exudate, no erythema and lips, buccal mucosa, and tongue normal  NECK: supple, thyroid normal size, non-tender, without nodularity LYMPH:  no palpable lymphadenopathy in the cervical, axillary or inguinal LUNGS: clear to auscultation and percussion with normal breathing effort HEART: regular rate & rhythm and no murmurs and no lower extremity edema ABDOMEN:abdomen soft, non-tender and normal bowel sounds MUSCULOSKELETAL:no cyanosis of digits and no clubbing  NEURO: alert & oriented x 3 with fluent speech, no focal motor/sensory deficits EXTREMITIES: No lower extremity edema BREAST: Rash on the lateral aspect of the right breast  and shoulder. (exam performed in the presence of a chaperone)  LABORATORY DATA:  I have reviewed the data as listed CMP Latest Ref Rng & Units 06/27/2017 11/04/2010  Glucose 65 - 99 mg/dL 93 90  BUN 6 - 20 mg/dL 9 7  Creatinine 0.44 - 1.00 mg/dL 0.83 0.78  Sodium 135 - 145 mmol/L 140 132(L)  Potassium 3.5 - 5.1 mmol/L 3.4(L) 3.7  Chloride 101 - 111 mmol/L 107 99  CO2 22 - 32 mmol/L 24 23  Calcium 8.9 - 10.3 mg/dL 9.2 9.1  Total Protein 6.5 - 8.1 g/dL 7.0 -  Total Bilirubin 0.3 - 1.2 mg/dL 1.0 -  Alkaline Phos 38 - 126 U/L 29(L) -  AST 15 - 41 U/L 26 -  ALT 14 - 54 U/L 15 -    Lab Results  Component Value Date   WBC 8.4 06/27/2017   HGB 13.4 06/27/2017   HCT 40.8 06/27/2017   MCV 89.5 06/27/2017   PLT 207 06/27/2017   NEUTROABS 2.9 06/27/2017    ASSESSMENT & PLAN:    Ductal carcinoma in situ (DCIS) of right breast 06/27/2018: Rt Lumpectomy: DCIS grade 1 spanning 0.3 cm, margins Neg, Intra ductal papilloma, UDH, CSL, Tis Nx Stage 0  Pathology counseling: I discussed the final pathology report of the patient provided  a copy of this report. I discussed the margins as well as lymph node surgeries. We also discussed the final staging along with previously performed ER/PR  Testing.  Return to clinic after radiation therapy to start antiestrogen pills with tamoxifen.  Patient has an appointment to see Dr. Lisbeth Renshaw next week.  I spent 25 minutes talking to the patient of which more than half was spent in counseling and coordination of care.  No orders of the defined types were placed in this encounter.  The patient has a good understanding of the overall plan. she agrees with it. she will call with any problems that may develop before the next visit here.   Harriette Ohara, MD 07/24/17

## 2017-07-24 NOTE — Assessment & Plan Note (Signed)
06/27/2018: Rt Lumpectomy: DCIS grade 1 spanning 0.3 cm, margins Neg, Intra ductal papilloma, UDH, CSL, Tis Nx Stage 0  Pathology counseling: I discussed the final pathology report of the patient provided  a copy of this report. I discussed the margins as well as lymph node surgeries. We also discussed the final staging along with previously performed ER/PR  Testing.  Return to clinic after radiation therapy to start antiestrogen pills with tamoxifen.  Patient has an appointment to see Dr. Lisbeth Renshaw next week.

## 2017-07-25 ENCOUNTER — Ambulatory Visit: Payer: Managed Care, Other (non HMO) | Admitting: Hematology and Oncology

## 2017-07-25 NOTE — Progress Notes (Signed)
Location of Breast Cancer:Right Breast 10  0'clock , Upper outer    FUN after surgery to move forward with the simulation and planning process.    Histology per Pathology Report: Diagnosis11/7/18 Breast, right, needle core biopsy, upper central - FIBROCYSTIC CHANGE WITH CALCIFICATIONS - USUAL DUCTAL HYPERPLASIA. - NO MALIGNANCY IDENTIFIED. Diagnosis 05/08/17: 1. Breast, right, needle core biopsy, 8:30 o'clock, cylinder marker - DUCTAL CARCINOMA IN SITU WITH CALCIFICATIONS. - SEE COMMENT. 2. Breast, right, needle core biopsy, 10:00 o'clock, dumbbell marker - PSEUDOANGIOMATOUS STROMAL HYPERPLASIA. - FIBROCYSTIC CHANGES WITH ADENOSIS AND CALCIFICATIONS. - THERE IS NO EVIDENCE OF MALIGNANCY. Diagnosis 03/20/17 Breast, right, needle core biopsy, 4:00 o'clock, 2cmfn - FIBROCYSTIC CHANGES. - THERE IS NO EVIDENCE OF MALIGNANCY  Receptor Status: ER(95%+), PR (90%+), Her2-neu (), Ki-()  Did patient present with symptoms (if so, please note symptoms) or was this found on screening mammography?: routine screening mammogram     Past/Anticipated interventions by surgeon, if any: Dr. Cornett, MD   Diagnosis 06-27-17   Dr. Thomas  Cornett 1. Breast, lumpectomy, Right DUCTAL CARCINOMA IN SITU, GRADE 1, SPANNING 0.3 CM ALL MARGINS OF RESECTION ARE NEGATIVE FOR CARCINOMA INTRADUCTAL PAPILLOMA WITH USUAL DUCTAL HYPERPLASIA ADENOSIS WITH CALCIFICATIONS 2. Breast, excision, additional lateral margin right COMPLEX SCLEROSING LESION ADENOSIS WITH ASSOCIATED CALCIFICATIONS  Receptor Status: ER(95%+), PR (90%+), Her2-neu (), Ki-()    Past/Anticipated interventions by medical oncology, if any: Chemotherapy 07-24-17  radiation therapy, start antiestrogen pills with tamoxifen after radiation 06-19-17 Genetic testing, Dr. Gudena,05/25/17 new appt.    Lymphedema issues, if any: No Rom to right arm is good Skin to right breast healing still has bruising and tenderness to touch.  Had a skin  reaction to the surgical tape glue caused hives, dry skin, itching and heat to the right outer breast and right axilla Dr. Cornett advised to call family MD, Dr. Ramachandran to treat the skin recommended her to take benadryl 25 mg tid and use hydrocortisone oniment.  She use allegra in am benadryl hs. The skin irritation is improving  Had a follow up appointment with Dr. Cornett 07-12-17. Pain issues, if any: No ,right breast tenderness  SAFETY ISSUES: No   Prior radiation?  No  Pacemaker/ICD? No  Possible current pregnancy? No  Is the patient on methotrexate? No  Current Complaints / other details:  Married, non smoker, no smokeless tobacco ,Moderate alcohol use,  Maternal aunt breast cancer, living, age 74,  Had chemo/radiation, surgery, Maternal aunt  Pancreatic cancer deceased  Weight: Wt Readings from Last 3 Encounters:  08/01/17 136 lb (61.7 kg)  07/24/17 135 lb 12.8 oz (61.6 kg)  06/27/17 135 lb 4 oz (61.3 kg)    Menarche 8   G0 P0    BC 19 years  Menopause No   HRT No  Wt Readings from Last 3 Encounters:  08/01/17 136 lb (61.7 kg)  07/24/17 135 lb 12.8 oz (61.6 kg)  06/27/17 135 lb 4 oz (61.3 kg)   BP 136/86 (BP Location: Left Arm, Patient Position: Sitting, Cuff Size: Normal)   Pulse 98   Temp 98.2 F (36.8 C) (Oral)   Resp 18   Ht (!) 5" (0.127 m)   Wt 136 lb (61.7 kg)   LMP 07/16/2017   SpO2 100%   BMI 3824.74 kg/m               

## 2017-07-28 ENCOUNTER — Telehealth: Payer: Self-pay

## 2017-07-28 NOTE — Telephone Encounter (Signed)
Returned pt call regarding irritation to her incision site. Previously Dr. Lindi Adie put her on a Medrol dose pack and that has not helped cleared the site. Per Dr. Lindi Adie she needs to see the surgeons office. Explained to pt to inform them that Dr. Lindi Adie has seen it, attempted to treat it, and it has not Improved. Patient has understanding.  Cyndia Bent RN

## 2017-08-01 ENCOUNTER — Ambulatory Visit
Admission: RE | Admit: 2017-08-01 | Discharge: 2017-08-01 | Disposition: A | Discharge status: Home / Self Care | Payer: Managed Care, Other (non HMO) | Source: Ambulatory Visit | Attending: Radiation Oncology | Admitting: Radiation Oncology

## 2017-08-01 ENCOUNTER — Other Ambulatory Visit: Payer: Self-pay

## 2017-08-01 ENCOUNTER — Encounter: Payer: Self-pay | Admitting: Radiation Oncology

## 2017-08-01 VITALS — BP 136/86 | HR 98 | Temp 98.2°F | Resp 18 | Ht <= 58 in | Wt 136.0 lb

## 2017-08-01 DIAGNOSIS — R109 Unspecified abdominal pain: Secondary | ICD-10-CM | POA: Diagnosis not present

## 2017-08-01 DIAGNOSIS — Z510 Encounter for antineoplastic radiation therapy: Secondary | ICD-10-CM | POA: Diagnosis not present

## 2017-08-01 DIAGNOSIS — R630 Anorexia: Secondary | ICD-10-CM | POA: Diagnosis not present

## 2017-08-01 DIAGNOSIS — Z170 Estrogen receptor positive status [ER+]: Secondary | ICD-10-CM | POA: Diagnosis not present

## 2017-08-01 DIAGNOSIS — D0511 Intraductal carcinoma in situ of right breast: Secondary | ICD-10-CM

## 2017-08-01 DIAGNOSIS — Z79899 Other long term (current) drug therapy: Secondary | ICD-10-CM | POA: Diagnosis not present

## 2017-08-01 DIAGNOSIS — M069 Rheumatoid arthritis, unspecified: Secondary | ICD-10-CM | POA: Diagnosis not present

## 2017-08-01 DIAGNOSIS — R112 Nausea with vomiting, unspecified: Secondary | ICD-10-CM | POA: Diagnosis not present

## 2017-08-01 DIAGNOSIS — Z803 Family history of malignant neoplasm of breast: Secondary | ICD-10-CM | POA: Diagnosis not present

## 2017-08-01 NOTE — Progress Notes (Signed)
Radiation Oncology         [phone] ________________________________  Name: [name]        MRN: [id number]  Date of Service: 08/01/2017 DOB: 04-01-1971  WU:[id number], Rose Kaufmann, MD  Rose Lose, MD     REFERRING PHYSICIAN: Nicholas Lose, MD   DIAGNOSIS: The encounter diagnosis was Ductal carcinoma in situ (DCIS) of right breast.   HISTORY OF PRESENT ILLNESS: Rose [name] is a 47 y.o. female seen at the request of Dr. Brantley Stage for a new diagnosis of DCIS of the right breast. She noted nipple bleeding and underwent diagnostic imaging which revealed a 6 x 3 x 6 mm mass at 4:00 which had previously been 8 x 4 x7 mm. She underwent a biopsy on 03/20/17 revealing fibrocystic change at the 4:00 position. A bilateral breast MRI on 04/20/17 revealed a 1.3 x .8 x .8 cm mass posteriorly in the right breast and several 4-5 mm enhancing foci, bilaterally. No correlate to ultrasound was seen and  on 05/08/17, am MRI guided biopsy at 8:30 revealed ER/PR positive, grade grade 1 DCIS with calcifications. A 10:00 biopsy was negative for malignancy and revealed fibrocystic changes with adenosis and calcifications. And additional biopsy on 05/17/17 revealed benign fibrocystic changes in the right breast.   On 06/27/17 she underwent right lumpectomy revealing grade 1 DCIS, measuring 3 mm with negative margins. Within her specimen there was a CSL and usual ductal hyperplasia as well as a papilloma. She returns today to discuss the use of adjuvant treatment.   PREVIOUS RADIATION THERAPY: No   PAST MEDICAL HISTORY:  Past Medical History:  Diagnosis Date  . Abdominal pain   . Breast cancer ([place]) 05/17/2017   Right breast  . Chronic rheumatic arthritis ([place])   . Family history of breast cancer   . Family history of colon cancer   . Family history of pancreatic cancer   . Malignant neoplasm of right breast in female, estrogen receptor positive ([place]) 05/25/2017  . N&V (nausea and  vomiting)   . PONV (postoperative nausea and vomiting)   . Poor appetite        PAST SURGICAL HISTORY: Past Surgical History:  Procedure Laterality Date  . APPENDECTOMY  4/12  . BREAST LUMPECTOMY WITH RADIOACTIVE SEED LOCALIZATION Right 06/27/2017   Procedure: RIGHT BREAST LUMPECTOMY WITH RADIOACTIVE SEED LOCALIZATION;  Surgeon: Erroll Luna, MD;  Location: [facility];  Service: General;  Laterality: Right;  . COLPOSCOPY  1999  . HYSTEROSCOPY WITH RESECTOSCOPE  11/10  . MEDIAL COLLATERAL LIGAMENT REPAIR, KNEE Left 05/04/2017   Procedure: LEFT THUMB  COLLATERAL LIGAMENT REPAIR;  Surgeon: Leanora Cover, MD;  Location: [facility];  Service: Orthopedics;  Laterality: Left;     FAMILY HISTORY:  Family History  Problem Relation Age of Onset  . Breast cancer Maternal Aunt        70's  . Pancreatic cancer Maternal Aunt 63  . Colon cancer Other        PGF's sister     SOCIAL HISTORY:  reports that  has never smoked. she has never used smokeless tobacco. She reports that she drinks about 3.0 - 3.6 oz of alcohol per week. She reports that she does not use drugs. The patient is married and lives in [place]. 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 She does not have any children.   ALLERGIES: Patient has no known allergies.   MEDICATIONS:  Current  Outpatient Medications  Medication Sig Dispense Refill  . acetaminophen (TYLENOL) 500 MG tablet Take 650 mg by mouth every 6 (six) hours as needed.     . Dapsone (ACZONE) 5 % topical gel Apply topically 2 (two) times daily.    . fish oil-omega-3 fatty acids 1000 MG capsule Take 1,000 mg by mouth 2 (two) times daily.      . fluticasone (FLONASE) 50 MCG/ACT nasal spray USE 2 SPRAYS IN EACH NOSTRIL ONCE DAILY AS NEEDED 30 DAYS  6  . HYDROcodone-acetaminophen (NORCO) 5-325 MG tablet 1-2 tabs po q6 hours prn pain 20 tablet 0  . hydroxychloroquine (PLAQUENIL) 200  MG tablet Take by mouth 2 (two) times daily.      . predniSONE (DELTASONE) 10 MG tablet Take 10 mg by mouth daily with breakfast.    . Prenatal Vit-Fe Fumarate-FA (PNV PRENATAL PLUS MULTIVITAMIN) 27-1 MG TABS TAKE 1 TABLET EVERY DAY 30 tablet 11  . clindamycin-tretinoin (ZIANA) gel Apply topically daily.      Marland Kitchen ibuprofen (ADVIL,MOTRIN) 800 MG tablet Take 1 tablet (800 mg total) by mouth every 8 (eight) hours as needed. (Patient not taking: Reported on 08/01/2017) 30 tablet 0  . methocarbamol (ROBAXIN) 500 MG tablet Take 1 tablet (500 mg total) by mouth at bedtime as needed for muscle spasms. (Patient not taking: Reported on 08/01/2017) 12 tablet 0  . Multiple Vitamin (ONCE DAILY PO) Take by mouth 1 dose over 24 hours.      Marland Kitchen oxyCODONE (OXY IR/ROXICODONE) 5 MG immediate release tablet Take 1-2 tablets (5-10 mg total) by mouth every 6 (six) hours as needed for severe pain. (Patient not taking: Reported on 08/01/2017) 12 tablet 0  . tamoxifen (NOLVADEX) 20 MG tablet Take 1 tablet (20 mg total) daily by mouth. (Patient not taking: Reported on 08/01/2017) 90 tablet 3  . valACYclovir (VALTREX) 1000 MG tablet TAKE 1/2 TABLET BY MOUTH DAILY (Patient not taking: Reported on 08/01/2017) 30 tablet 2   No current facility-administered medications for this encounter.      REVIEW OF SYSTEMS: On review of systems, the patient reports that she is doing well overall. She denies any chest pain, shortness of breath, cough, fevers, chills, night sweats, unintended weight changes. She denies any bowel or bladder disturbances, and denies abdominal pain, nausea or vomiting. She denies any new musculoskeletal or joint aches or pains. She reports the rash that she developed a few weeks ago and since using prednisone this has improved. She has no new skin lesions or concerns. A complete review of systems is obtained and is otherwise negative.      PHYSICAL EXAM:  Wt Readings from Last 3 Encounters:  08/01/17 136 lb (61.7 kg)   07/24/17 135 lb 12.8 oz (61.6 kg)  06/27/17 135 lb 4 oz (61.3 kg)   Temp Readings from Last 3 Encounters:  08/01/17 98.2 F (36.8 C) (Oral)  07/24/17 98.5 F (36.9 C) (Oral)  06/27/17 97.9 F (36.6 C)   BP Readings from Last 3 Encounters:  08/01/17 136/86  07/24/17 (!) 140/92  06/27/17 117/83   Pulse Readings from Last 3 Encounters:  08/01/17 98  07/24/17 91  06/27/17 86   In general this is a well appearing African American female in no acute distress. She is alert and oriented x4 and appropriate throughout the examination. HEENT reveals that the patient is normocephalic, atraumatic. EOMs are intact. Cardiopulmonary assessment is negative for acute distress and she exhibits normal effort. Breast exam on the right reveals a  well healing lumpectomy scar. She has some mild papules in the right arm from the shoulder to the elbow. She does have some evidence of capillary dilatation lateral to the breast. No active rash is otherwise noted.  ECOG = 1  0 - Asymptomatic (Fully active, able to carry on all predisease activities without restriction)  1 - Symptomatic but completely ambulatory (Restricted in physically strenuous activity but ambulatory and able to carry out work of a light or sedentary nature. For example, light housework, office work)  2 - Symptomatic, <50% in bed during the day (Ambulatory and capable of all self care but unable to carry out any work activities. Up and about more than 50% of waking hours)  3 - Symptomatic, >50% in bed, but not bedbound (Capable of only limited self-care, confined to bed or chair 50% or more of waking hours)  4 - Bedbound (Completely disabled. Cannot carry on any self-care. Totally confined to bed or chair)  5 - Death   Eustace Pen MM, Creech RH, Tormey DC, et al. [phone]). "Toxicity and response criteria of the [facility]". [place] Oncol. 5 (6): 649-55    LABORATORY DATA:  Lab Results  Component Value Date    WBC 8.4 06/27/2017   HGB 13.4 06/27/2017   HCT 40.8 06/27/2017   MCV 89.5 06/27/2017   PLT 207 06/27/2017   Lab Results  Component Value Date   NA 140 06/27/2017   K 3.4 (L) 06/27/2017   CL 107 06/27/2017   CO2 24 06/27/2017   Lab Results  Component Value Date   ALT 15 06/27/2017   AST 26 06/27/2017   ALKPHOS 29 (L) 06/27/2017   BILITOT 1.0 06/27/2017      RADIOGRAPHY: No results found.     IMPRESSION/PLAN: 1. Grade 1, ER/PR positive DCIS of the right breast. We reviewed the findings from her pathology and reviewed the rationale to proceed with radiotherapy to the breast. She will also take antiestrogen therapy once she completes radiotherapy. We discussed the risks, benefits, short, and long term effects of radiotherapy, and the patient is interested in proceeding. Dr. Lisbeth Renshaw discusses the delivery and logistics of radiotherapy and anticipates a course of 6 1/2 weeks. She is interested in proceeding with simulation in the next week or two. Our staff will call her to coordinate this. Written consent is obtained and placed in the chart, a copy was provided to the patient. 2. Rheumatoid Arthritis. The patient is on Orencia for this and will be able to continue during therapy.  3. Skin irruption. The patient has completed a course of oral prednisone. We will follow this along as well expectantly during treatment.  In a visit lasting 25 minutes, greater than 50% of the time was spent face to face discussing her course, and coordinating the patient's care.  The above documentation reflects my direct findings during this shared patient visit. Please see the separate note by Dr. Lisbeth Renshaw on this date for the remainder of the patient's plan of care.    Carola Rhine, PAC

## 2017-08-01 NOTE — Addendum Note (Signed)
Encounter addended by: Malena Edman, RN on: 08/01/2017 1:17 PM  Actions taken: Charge Capture section accepted

## 2017-08-03 ENCOUNTER — Ambulatory Visit
Admission: RE | Admit: 2017-08-03 | Discharge: 2017-08-03 | Disposition: A | Discharge status: Home / Self Care | Payer: Managed Care, Other (non HMO) | Source: Ambulatory Visit | Attending: Radiation Oncology | Admitting: Radiation Oncology

## 2017-08-03 DIAGNOSIS — D0511 Intraductal carcinoma in situ of right breast: Secondary | ICD-10-CM

## 2017-08-03 NOTE — Progress Notes (Signed)
  Radiation Oncology         [phone] ________________________________  Name: Rose [name] MRN: [id number]  Date: 08/03/2017  DOB: 09-19-1970  Optical Surface Tracking Plan:  Since intensity modulated radiotherapy (IMRT) and 3D conformal radiation treatment methods are predicated on accurate and precise positioning for treatment, intrafraction motion monitoring is medically necessary to ensure accurate and safe treatment delivery.  The ability to quantify intrafraction motion without excessive ionizing radiation dose can only be performed with optical surface tracking. Accordingly, surface imaging offers the opportunity to obtain 3D measurements of patient position throughout IMRT and 3D treatments without excessive radiation exposure.  I am ordering optical surface tracking for this patient's upcoming course of radiotherapy. ________________________________  Kyung Rudd, MD 08/03/2017 10:00 AM    Reference:   Ursula Alert, J, et al. Surface imaging-based analysis of intrafraction motion for breast radiotherapy patients.Journal of [place], n. 6, nov. 2014. ISSN [id number].   Available at: <http://www.jacmp.org/index.php/jacmp/article/view/4957>.

## 2017-08-03 NOTE — Progress Notes (Signed)
  Radiation Oncology         [phone]) [phone] ________________________________  Name: Rose [name] MRN: [id number]  Date: 08/03/2017  DOB: 30-Jul-1970  DIAGNOSIS:     ICD-10-CM   1. Ductal carcinoma in situ (DCIS) of right breast D05.11      SIMULATION AND TREATMENT PLANNING NOTE  The patient presented for simulation prior to beginning her course of radiation treatment for her diagnosis of right-sided breast cancer. The patient was placed in a supine position on a breast board. A customized vac-lock bag was constructed and this complex treatment device will be used on a daily basis during her treatment. In this fashion, a CT scan was obtained through the chest area and an isocenter was placed near the chest wall within the breast.  The patient will be planned to receive a course of radiation initially to a dose of 50.4 Gy. This will consist of a whole breast radiotherapy technique. To accomplish this, 2 customized blocks have been designed which will correspond to medial and lateral whole breast tangent fields. This treatment will be accomplished at 1.8 Gy per fraction. A forward planning technique will also be evaluated to determine if this approach improves the plan. It is anticipated that the patient will then receive a 10 Gy boost to the seroma cavity which has been contoured. This will be accomplished at 2 Gy per fraction.   This initial treatment will consist of a 3-D conformal technique. The seroma has been contoured as the primary target structure. Additionally, dose volume histograms of both this target as well as the lungs and heart will also be evaluated. Such an approach is necessary to ensure that the target area is adequately covered while the nearby critical  normal structures are adequately spared.  Plan:  The final anticipated total dose therefore will correspond to 60.4 Gy.    _______________________________   Jodelle Gross, MD, PhD

## 2017-08-07 ENCOUNTER — Telehealth: Payer: Self-pay | Admitting: Hematology and Oncology

## 2017-08-07 NOTE — Telephone Encounter (Signed)
Spoke to patient regarding upcoming March appointments per 1/28 sch message

## 2017-08-09 DIAGNOSIS — D0511 Intraductal carcinoma in situ of right breast: Secondary | ICD-10-CM | POA: Diagnosis not present

## 2017-08-10 ENCOUNTER — Ambulatory Visit
Admission: RE | Admit: 2017-08-10 | Discharge: 2017-08-10 | Disposition: A | Discharge status: Home / Self Care | Payer: Managed Care, Other (non HMO) | Source: Ambulatory Visit | Attending: Radiation Oncology | Admitting: Radiation Oncology

## 2017-08-10 DIAGNOSIS — D0511 Intraductal carcinoma in situ of right breast: Secondary | ICD-10-CM | POA: Diagnosis not present

## 2017-08-14 ENCOUNTER — Ambulatory Visit
Admission: RE | Admit: 2017-08-14 | Discharge: 2017-08-14 | Disposition: A | Discharge status: Home / Self Care | Payer: Managed Care, Other (non HMO) | Source: Ambulatory Visit | Attending: Radiation Oncology | Admitting: Radiation Oncology

## 2017-08-14 DIAGNOSIS — D0511 Intraductal carcinoma in situ of right breast: Secondary | ICD-10-CM | POA: Diagnosis not present

## 2017-08-15 ENCOUNTER — Ambulatory Visit
Admission: RE | Admit: 2017-08-15 | Discharge: 2017-08-15 | Disposition: A | Discharge status: Home / Self Care | Payer: Managed Care, Other (non HMO) | Source: Ambulatory Visit | Attending: Radiation Oncology | Admitting: Radiation Oncology

## 2017-08-15 DIAGNOSIS — D0511 Intraductal carcinoma in situ of right breast: Secondary | ICD-10-CM | POA: Diagnosis not present

## 2017-08-16 ENCOUNTER — Ambulatory Visit
Admission: RE | Admit: 2017-08-16 | Discharge: 2017-08-16 | Disposition: A | Discharge status: Home / Self Care | Payer: Managed Care, Other (non HMO) | Source: Ambulatory Visit | Attending: Radiation Oncology | Admitting: Radiation Oncology

## 2017-08-16 DIAGNOSIS — D0511 Intraductal carcinoma in situ of right breast: Secondary | ICD-10-CM | POA: Diagnosis not present

## 2017-08-17 ENCOUNTER — Ambulatory Visit
Admission: RE | Admit: 2017-08-17 | Discharge: 2017-08-17 | Disposition: A | Discharge status: Home / Self Care | Payer: Managed Care, Other (non HMO) | Source: Ambulatory Visit | Attending: Radiation Oncology | Admitting: Radiation Oncology

## 2017-08-17 DIAGNOSIS — D0511 Intraductal carcinoma in situ of right breast: Secondary | ICD-10-CM | POA: Diagnosis not present

## 2017-08-18 ENCOUNTER — Ambulatory Visit
Admission: RE | Admit: 2017-08-18 | Discharge: 2017-08-18 | Disposition: A | Discharge status: Home / Self Care | Payer: Managed Care, Other (non HMO) | Source: Ambulatory Visit | Attending: Radiation Oncology | Admitting: Radiation Oncology

## 2017-08-18 DIAGNOSIS — Z803 Family history of malignant neoplasm of breast: Secondary | ICD-10-CM

## 2017-08-18 DIAGNOSIS — D0511 Intraductal carcinoma in situ of right breast: Secondary | ICD-10-CM | POA: Diagnosis not present

## 2017-08-18 MED ORDER — ALRA NON-METALLIC DEODORANT (RAD-ONC)
1.0000 "application " | Freq: Once | TOPICAL | Status: AC
  Administered 2017-08-18: 1 via TOPICAL

## 2017-08-18 MED ORDER — RADIAPLEXRX EX GEL
Freq: Two times a day (BID) | CUTANEOUS | Status: DC
  Administered 2017-08-18: 16:00:00 via TOPICAL

## 2017-08-18 NOTE — Progress Notes (Signed)

## 2017-08-21 ENCOUNTER — Ambulatory Visit
Admission: RE | Admit: 2017-08-21 | Discharge: 2017-08-21 | Disposition: A | Discharge status: Home / Self Care | Payer: Managed Care, Other (non HMO) | Source: Ambulatory Visit | Attending: Radiation Oncology | Admitting: Radiation Oncology

## 2017-08-21 DIAGNOSIS — D0511 Intraductal carcinoma in situ of right breast: Secondary | ICD-10-CM | POA: Diagnosis not present

## 2017-08-22 ENCOUNTER — Encounter: Payer: Self-pay | Admitting: Radiation Oncology

## 2017-08-22 ENCOUNTER — Ambulatory Visit
Admission: RE | Admit: 2017-08-22 | Discharge: 2017-08-22 | Disposition: A | Discharge status: Home / Self Care | Payer: Managed Care, Other (non HMO) | Source: Ambulatory Visit | Attending: Radiation Oncology | Admitting: Radiation Oncology

## 2017-08-22 DIAGNOSIS — D0511 Intraductal carcinoma in situ of right breast: Secondary | ICD-10-CM | POA: Diagnosis not present

## 2017-08-22 NOTE — Progress Notes (Signed)
Rose [name] forms are completed and faxed back to [facility]. Copy will be scanned.

## 2017-08-23 ENCOUNTER — Ambulatory Visit
Admission: RE | Admit: 2017-08-23 | Discharge: 2017-08-23 | Disposition: A | Discharge status: Home / Self Care | Payer: Managed Care, Other (non HMO) | Source: Ambulatory Visit | Attending: Radiation Oncology | Admitting: Radiation Oncology

## 2017-08-23 DIAGNOSIS — D0511 Intraductal carcinoma in situ of right breast: Secondary | ICD-10-CM | POA: Diagnosis not present

## 2017-08-24 ENCOUNTER — Ambulatory Visit
Admission: RE | Admit: 2017-08-24 | Discharge: 2017-08-24 | Disposition: A | Discharge status: Home / Self Care | Payer: Managed Care, Other (non HMO) | Source: Ambulatory Visit | Attending: Radiation Oncology | Admitting: Radiation Oncology

## 2017-08-24 DIAGNOSIS — D0511 Intraductal carcinoma in situ of right breast: Secondary | ICD-10-CM | POA: Diagnosis not present

## 2017-08-25 ENCOUNTER — Ambulatory Visit
Admission: RE | Admit: 2017-08-25 | Discharge: 2017-08-25 | Disposition: A | Discharge status: Home / Self Care | Payer: Managed Care, Other (non HMO) | Source: Ambulatory Visit | Attending: Radiation Oncology | Admitting: Radiation Oncology

## 2017-08-25 DIAGNOSIS — D0511 Intraductal carcinoma in situ of right breast: Secondary | ICD-10-CM | POA: Diagnosis not present

## 2017-08-28 ENCOUNTER — Ambulatory Visit
Admission: RE | Admit: 2017-08-28 | Discharge: 2017-08-28 | Disposition: A | Discharge status: Home / Self Care | Payer: Managed Care, Other (non HMO) | Source: Ambulatory Visit | Attending: Radiation Oncology | Admitting: Radiation Oncology

## 2017-08-28 DIAGNOSIS — D0511 Intraductal carcinoma in situ of right breast: Secondary | ICD-10-CM

## 2017-08-28 MED ORDER — RADIAPLEXRX EX GEL
Freq: Once | CUTANEOUS | Status: AC
  Administered 2017-08-28: 16:00:00 via TOPICAL

## 2017-08-29 ENCOUNTER — Ambulatory Visit
Admission: RE | Admit: 2017-08-29 | Discharge: 2017-08-29 | Disposition: A | Discharge status: Home / Self Care | Payer: Managed Care, Other (non HMO) | Source: Ambulatory Visit | Attending: Radiation Oncology | Admitting: Radiation Oncology

## 2017-08-29 DIAGNOSIS — D0511 Intraductal carcinoma in situ of right breast: Secondary | ICD-10-CM | POA: Diagnosis not present

## 2017-08-30 ENCOUNTER — Ambulatory Visit
Admission: RE | Admit: 2017-08-30 | Discharge: 2017-08-30 | Disposition: A | Discharge status: Home / Self Care | Payer: Managed Care, Other (non HMO) | Source: Ambulatory Visit | Attending: Radiation Oncology | Admitting: Radiation Oncology

## 2017-08-30 DIAGNOSIS — D0511 Intraductal carcinoma in situ of right breast: Secondary | ICD-10-CM | POA: Diagnosis not present

## 2017-08-31 ENCOUNTER — Ambulatory Visit
Admission: RE | Admit: 2017-08-31 | Discharge: 2017-08-31 | Disposition: A | Discharge status: Home / Self Care | Payer: Managed Care, Other (non HMO) | Source: Ambulatory Visit | Attending: Radiation Oncology | Admitting: Radiation Oncology

## 2017-08-31 DIAGNOSIS — D0511 Intraductal carcinoma in situ of right breast: Secondary | ICD-10-CM | POA: Diagnosis not present

## 2017-09-01 ENCOUNTER — Ambulatory Visit: Payer: Managed Care, Other (non HMO)

## 2017-09-04 ENCOUNTER — Ambulatory Visit
Admission: RE | Admit: 2017-09-04 | Discharge: 2017-09-04 | Disposition: A | Discharge status: Home / Self Care | Payer: Managed Care, Other (non HMO) | Source: Ambulatory Visit | Attending: Radiation Oncology | Admitting: Radiation Oncology

## 2017-09-04 DIAGNOSIS — D0511 Intraductal carcinoma in situ of right breast: Secondary | ICD-10-CM | POA: Diagnosis not present

## 2017-09-05 ENCOUNTER — Ambulatory Visit
Admission: RE | Admit: 2017-09-05 | Discharge: 2017-09-05 | Disposition: A | Discharge status: Home / Self Care | Payer: Managed Care, Other (non HMO) | Source: Ambulatory Visit | Attending: Radiation Oncology | Admitting: Radiation Oncology

## 2017-09-05 DIAGNOSIS — D0511 Intraductal carcinoma in situ of right breast: Secondary | ICD-10-CM | POA: Diagnosis not present

## 2017-09-06 ENCOUNTER — Ambulatory Visit
Admission: RE | Admit: 2017-09-06 | Discharge: 2017-09-06 | Disposition: A | Discharge status: Home / Self Care | Payer: Managed Care, Other (non HMO) | Source: Ambulatory Visit | Attending: Radiation Oncology | Admitting: Radiation Oncology

## 2017-09-06 DIAGNOSIS — D0511 Intraductal carcinoma in situ of right breast: Secondary | ICD-10-CM | POA: Diagnosis not present

## 2017-09-07 ENCOUNTER — Ambulatory Visit
Admission: RE | Admit: 2017-09-07 | Discharge: 2017-09-07 | Disposition: A | Discharge status: Home / Self Care | Payer: Managed Care, Other (non HMO) | Source: Ambulatory Visit | Attending: Radiation Oncology | Admitting: Radiation Oncology

## 2017-09-07 DIAGNOSIS — D0511 Intraductal carcinoma in situ of right breast: Secondary | ICD-10-CM | POA: Diagnosis not present

## 2017-09-08 ENCOUNTER — Ambulatory Visit
Admission: RE | Admit: 2017-09-08 | Discharge: 2017-09-08 | Disposition: A | Discharge status: Home / Self Care | Payer: Managed Care, Other (non HMO) | Source: Ambulatory Visit | Attending: Radiation Oncology | Admitting: Radiation Oncology

## 2017-09-08 DIAGNOSIS — Z510 Encounter for antineoplastic radiation therapy: Secondary | ICD-10-CM | POA: Insufficient documentation

## 2017-09-08 DIAGNOSIS — Z170 Estrogen receptor positive status [ER+]: Secondary | ICD-10-CM | POA: Insufficient documentation

## 2017-09-08 DIAGNOSIS — D0511 Intraductal carcinoma in situ of right breast: Secondary | ICD-10-CM | POA: Diagnosis present

## 2017-09-11 ENCOUNTER — Ambulatory Visit
Admission: RE | Admit: 2017-09-11 | Discharge: 2017-09-11 | Disposition: A | Discharge status: Home / Self Care | Payer: Managed Care, Other (non HMO) | Source: Ambulatory Visit | Attending: Radiation Oncology | Admitting: Radiation Oncology

## 2017-09-11 DIAGNOSIS — D0511 Intraductal carcinoma in situ of right breast: Secondary | ICD-10-CM | POA: Diagnosis not present

## 2017-09-12 ENCOUNTER — Ambulatory Visit
Admission: RE | Admit: 2017-09-12 | Discharge: 2017-09-12 | Disposition: A | Discharge status: Home / Self Care | Payer: Managed Care, Other (non HMO) | Source: Ambulatory Visit | Attending: Radiation Oncology | Admitting: Radiation Oncology

## 2017-09-12 DIAGNOSIS — D0511 Intraductal carcinoma in situ of right breast: Secondary | ICD-10-CM | POA: Diagnosis not present

## 2017-09-13 ENCOUNTER — Ambulatory Visit
Admission: RE | Admit: 2017-09-13 | Discharge: 2017-09-13 | Disposition: A | Discharge status: Home / Self Care | Payer: Managed Care, Other (non HMO) | Source: Ambulatory Visit | Attending: Radiation Oncology | Admitting: Radiation Oncology

## 2017-09-13 DIAGNOSIS — D0511 Intraductal carcinoma in situ of right breast: Secondary | ICD-10-CM | POA: Diagnosis not present

## 2017-09-14 ENCOUNTER — Ambulatory Visit
Admission: RE | Admit: 2017-09-14 | Discharge: 2017-09-14 | Disposition: A | Discharge status: Home / Self Care | Payer: Managed Care, Other (non HMO) | Source: Ambulatory Visit | Attending: Radiation Oncology | Admitting: Radiation Oncology

## 2017-09-14 DIAGNOSIS — D0511 Intraductal carcinoma in situ of right breast: Secondary | ICD-10-CM | POA: Diagnosis not present

## 2017-09-15 ENCOUNTER — Ambulatory Visit
Admission: RE | Admit: 2017-09-15 | Discharge: 2017-09-15 | Disposition: A | Discharge status: Home / Self Care | Payer: Managed Care, Other (non HMO) | Source: Ambulatory Visit | Attending: Radiation Oncology | Admitting: Radiation Oncology

## 2017-09-15 ENCOUNTER — Ambulatory Visit: Payer: Managed Care, Other (non HMO) | Admitting: Radiation Oncology

## 2017-09-15 DIAGNOSIS — D0511 Intraductal carcinoma in situ of right breast: Secondary | ICD-10-CM

## 2017-09-15 MED ORDER — RADIAPLEXRX EX GEL
Freq: Once | CUTANEOUS | Status: AC
  Administered 2017-09-15: 16:00:00 via TOPICAL

## 2017-09-18 ENCOUNTER — Ambulatory Visit
Admission: RE | Admit: 2017-09-18 | Discharge: 2017-09-18 | Disposition: A | Discharge status: Home / Self Care | Payer: Managed Care, Other (non HMO) | Source: Ambulatory Visit | Attending: Radiation Oncology | Admitting: Radiation Oncology

## 2017-09-18 DIAGNOSIS — D0511 Intraductal carcinoma in situ of right breast: Secondary | ICD-10-CM | POA: Diagnosis not present

## 2017-09-19 ENCOUNTER — Ambulatory Visit
Admission: RE | Admit: 2017-09-19 | Discharge: 2017-09-19 | Disposition: A | Discharge status: Home / Self Care | Payer: Managed Care, Other (non HMO) | Source: Ambulatory Visit | Attending: Radiation Oncology | Admitting: Radiation Oncology

## 2017-09-19 DIAGNOSIS — D0511 Intraductal carcinoma in situ of right breast: Secondary | ICD-10-CM | POA: Diagnosis not present

## 2017-09-20 ENCOUNTER — Ambulatory Visit
Admission: RE | Admit: 2017-09-20 | Discharge: 2017-09-20 | Disposition: A | Discharge status: Home / Self Care | Payer: Managed Care, Other (non HMO) | Source: Ambulatory Visit | Attending: Radiation Oncology | Admitting: Radiation Oncology

## 2017-09-20 DIAGNOSIS — D0511 Intraductal carcinoma in situ of right breast: Secondary | ICD-10-CM | POA: Diagnosis not present

## 2017-09-21 ENCOUNTER — Ambulatory Visit: Payer: Managed Care, Other (non HMO)

## 2017-09-21 ENCOUNTER — Ambulatory Visit
Admission: RE | Admit: 2017-09-21 | Discharge: 2017-09-21 | Disposition: A | Discharge status: Home / Self Care | Payer: Managed Care, Other (non HMO) | Source: Ambulatory Visit | Attending: Radiation Oncology | Admitting: Radiation Oncology

## 2017-09-21 DIAGNOSIS — D0511 Intraductal carcinoma in situ of right breast: Secondary | ICD-10-CM | POA: Diagnosis not present

## 2017-09-22 ENCOUNTER — Ambulatory Visit: Payer: Managed Care, Other (non HMO)

## 2017-09-22 ENCOUNTER — Ambulatory Visit
Admission: RE | Admit: 2017-09-22 | Discharge: 2017-09-22 | Disposition: A | Discharge status: Home / Self Care | Payer: Managed Care, Other (non HMO) | Source: Ambulatory Visit | Attending: Radiation Oncology | Admitting: Radiation Oncology

## 2017-09-22 DIAGNOSIS — D0511 Intraductal carcinoma in situ of right breast: Secondary | ICD-10-CM | POA: Diagnosis not present

## 2017-09-25 ENCOUNTER — Ambulatory Visit
Admission: RE | Admit: 2017-09-25 | Discharge: 2017-09-25 | Disposition: A | Discharge status: Home / Self Care | Payer: Managed Care, Other (non HMO) | Source: Ambulatory Visit | Attending: Radiation Oncology | Admitting: Radiation Oncology

## 2017-09-25 DIAGNOSIS — D0511 Intraductal carcinoma in situ of right breast: Secondary | ICD-10-CM | POA: Diagnosis not present

## 2017-09-26 ENCOUNTER — Telehealth: Payer: Self-pay | Admitting: Adult Health

## 2017-09-26 ENCOUNTER — Encounter: Payer: Self-pay | Admitting: *Deleted

## 2017-09-26 ENCOUNTER — Ambulatory Visit
Admission: RE | Admit: 2017-09-26 | Discharge: 2017-09-26 | Disposition: A | Discharge status: Home / Self Care | Payer: Managed Care, Other (non HMO) | Source: Ambulatory Visit | Attending: Radiation Oncology | Admitting: Radiation Oncology

## 2017-09-26 ENCOUNTER — Inpatient Hospital Stay: Payer: Managed Care, Other (non HMO) | Attending: Hematology and Oncology | Admitting: Hematology and Oncology

## 2017-09-26 DIAGNOSIS — D0511 Intraductal carcinoma in situ of right breast: Secondary | ICD-10-CM

## 2017-09-26 MED ORDER — RADIAPLEXRX EX GEL
Freq: Once | CUTANEOUS | Status: AC
  Administered 2017-09-26: 17:00:00 via TOPICAL

## 2017-09-26 NOTE — Assessment & Plan Note (Signed)
06/27/2018: Rt Lumpectomy: DCIS grade 1 spanning 0.3 cm, margins Neg, Intra ductal papilloma, UDH, CSL, Tis Nx Stage 0 Adjuvant radiation therapy 08/14/2017-09/26/2017  Treatment plan: Adjuvant antiestrogen therapy with tamoxifen 20 mg daily times 5 years  Tamoxifen counseling: We discussed the risks and benefits of tamoxifen. These include but not limited to insomnia, hot flashes, mood changes, vaginal dryness, and weight gain. Although rare, serious side effects including endometrial cancer, risk of blood clots were also discussed. We strongly believe that the benefits far outweigh the risks. Patient understands these risks and consented to starting treatment. Planned treatment duration is 5 years.  Return to clinic in 3 months for survivorship care plan visit

## 2017-09-26 NOTE — Progress Notes (Signed)
Patient Care Team: Merrilee Seashore, MD as PCP - General (Internal Medicine)  DIAGNOSIS:  Encounter Diagnosis  Name Primary?  . Ductal carcinoma in situ (DCIS) of right breast     SUMMARY OF ONCOLOGIC HISTORY:   Ductal carcinoma in situ (DCIS) of right breast   04/20/2017 Breast MRI    Right breast middle to posterior depth 1.3 x 0.8 x 0.9 cm suspicious mass spiculated mass, several 4-5 mm enhancing foci, multiple bilateral enhancing foci that are less than 5 mm      05/08/2017 Initial Diagnosis    Right breast biopsy 830 position: DCIS with calcifications, low-grade, ER 95%, PR 90%, Tis N0 stage 0; right breast 10:00:PASH      05/25/2017 -  Anti-estrogen oral therapy    Tamoxifen 20 mg daily      06/19/2017 Genetic Testing    BRIP1 c.3103C>T (p.Arg1035Cys), MSH6 c.2667G>T (p.Gln889His) and NBN c.1684G>A (p.Val562Ile) VUSs found on the comprehensive Common Cancer panel.  The Comprehensive Common Cancer Panel offered by [facility] includes sequencing and/or deletion duplication testing of the following 46 genes: APC, ATM, AXIN2, BAP1, BARD1, BMPR1A, BRCA1, BRCA2, BRIP1, CDH1, CDK4, CDKN2A, CHEK2, EPCAM, FANCC, FH, FLCN, HOXB13, MET, MITF,  MLH1, MSH2, MSH6, MUTYH, NBN, NF1, NTHL1,  PALB2, PMS2, POLD1, POLE, POT1, PTEN, RAD51C, RAD51D, RECQL, SCG5/GREM1, SDHB, SDHC, SDHD, SMAD4, STK11, TP53, TSC1, TSC2, and VHL.   The report date is June 19, 2017.       06/27/2017 Surgery    Rt Lumpectomy: DCIS grade 1 spanning 0.3 cm, margins Neg, Intra ductal papilloma, UDH, CSL, Tis Nx Stage 0      08/14/2017 - 09/26/2017 Radiation Therapy    Adjuvant radiation therapy       CHIEF COMPLIANT: Follow-up towards the end of radiation  INTERVAL HISTORY: Rose [name] is a 47 year old with above-mentioned history of DCIS involving the right breast who underwent lumpectomy and adjuvant radiation is ongoing and she has 4 more days of radiation left.  She is otherwise doing quite well.   Denies any major radiation dermatitis.  REVIEW OF SYSTEMS:   Constitutional: Denies fevers, chills or abnormal weight loss Eyes: Denies blurriness of vision Ears, nose, mouth, throat, and face: Denies mucositis or sore throat Respiratory: Denies cough, dyspnea or wheezes Cardiovascular: Denies palpitation, chest discomfort Gastrointestinal:  Denies nausea, heartburn or change in bowel habits Skin: Denies abnormal skin rashes Lymphatics: Denies new lymphadenopathy or easy bruising Neurological:Denies numbness, tingling or new weaknesses Behavioral/Psych: Mood is stable, no new changes  Extremities: No lower extremity edema  All other systems were reviewed with the patient and are negative.  I have reviewed the past medical history, past surgical history, social history and family history with the patient and they are unchanged from previous note.  ALLERGIES:  has No Known Allergies.  MEDICATIONS:  Current Outpatient Medications  Medication Sig Dispense Refill  . acetaminophen (TYLENOL) 500 MG tablet Take 650 mg by mouth every 6 (six) hours as needed.     . clindamycin-tretinoin (ZIANA) gel Apply topically daily.      . Dapsone (ACZONE) 5 % topical gel Apply topically 2 (two) times daily.    . fish oil-omega-3 fatty acids 1000 MG capsule Take 1,000 mg by mouth 2 (two) times daily.      . fluticasone (FLONASE) 50 MCG/ACT nasal spray USE 2 SPRAYS IN EACH NOSTRIL ONCE DAILY AS NEEDED 30 DAYS  6  . HYDROcodone-acetaminophen (NORCO) 5-325 MG tablet 1-2 tabs po q6 hours prn pain 20 tablet  0  . hydroxychloroquine (PLAQUENIL) 200 MG tablet Take by mouth 2 (two) times daily.      . methocarbamol (ROBAXIN) 500 MG tablet Take 1 tablet (500 mg total) by mouth at bedtime as needed for muscle spasms. (Patient not taking: Reported on 08/01/2017) 12 tablet 0  . Multiple Vitamin (ONCE DAILY PO) Take by mouth 1 dose over 24 hours.      . predniSONE (DELTASONE) 10 MG tablet Take 10 mg by mouth daily with  breakfast.    . Prenatal Vit-Fe Fumarate-FA (PNV PRENATAL PLUS MULTIVITAMIN) 27-1 MG TABS TAKE 1 TABLET EVERY DAY 30 tablet 11  . tamoxifen (NOLVADEX) 20 MG tablet Take 1 tablet (20 mg total) daily by mouth. (Patient not taking: Reported on 08/01/2017) 90 tablet 3  . valACYclovir (VALTREX) 1000 MG tablet TAKE 1/2 TABLET BY MOUTH DAILY (Patient not taking: Reported on 08/01/2017) 30 tablet 2   No current facility-administered medications for this visit.     PHYSICAL EXAMINATION: ECOG PERFORMANCE STATUS: 1 - Symptomatic but completely ambulatory  Vitals:   09/26/17 1533  BP: 130/87  Pulse: 100  Resp: 18  Temp: 98.9 F (37.2 C)  SpO2: 99%   Filed Weights   09/26/17 1533  Weight: 136 lb 8 oz (61.9 kg)    GENERAL:alert, no distress and comfortable SKIN: skin color, texture, turgor are normal, no rashes or significant lesions EYES: normal, Conjunctiva are pink and non-injected, sclera clear OROPHARYNX:no exudate, no erythema and lips, buccal mucosa, and tongue normal  NECK: supple, thyroid normal size, non-tender, without nodularity LYMPH:  no palpable lymphadenopathy in the cervical, axillary or inguinal LUNGS: clear to auscultation and percussion with normal breathing effort HEART: regular rate & rhythm and no murmurs and no lower extremity edema ABDOMEN:abdomen soft, non-tender and normal bowel sounds MUSCULOSKELETAL:no cyanosis of digits and no clubbing  NEURO: alert & oriented x 3 with fluent speech, no focal motor/sensory deficits EXTREMITIES: No lower extremity edema   LABORATORY DATA:  I have reviewed the data as listed CMP Latest Ref Rng & Units 06/27/2017 11/04/2010  Glucose 65 - 99 mg/dL 93 90  BUN 6 - 20 mg/dL 9 7  Creatinine 0.44 - 1.00 mg/dL 0.83 0.78  Sodium 135 - 145 mmol/L 140 132(L)  Potassium 3.5 - 5.1 mmol/L 3.4(L) 3.7  Chloride 101 - 111 mmol/L 107 99  CO2 22 - 32 mmol/L 24 23  Calcium 8.9 - 10.3 mg/dL 9.2 9.1  Total Protein 6.5 - 8.1 g/dL 7.0 -  Total  Bilirubin 0.3 - 1.2 mg/dL 1.0 -  Alkaline Phos 38 - 126 U/L 29(L) -  AST 15 - 41 U/L 26 -  ALT 14 - 54 U/L 15 -    Lab Results  Component Value Date   WBC 8.4 06/27/2017   HGB 13.4 06/27/2017   HCT 40.8 06/27/2017   MCV 89.5 06/27/2017   PLT 207 06/27/2017   NEUTROABS 2.9 06/27/2017    ASSESSMENT & PLAN:  Ductal carcinoma in situ (DCIS) of right breast 06/27/2018: Rt Lumpectomy: DCIS grade 1 spanning 0.3 cm, margins Neg, Intra ductal papilloma, UDH, CSL, Tis Nx Stage 0 Adjuvant radiation therapy 08/14/2017-09/26/2017  Treatment plan: Adjuvant antiestrogen therapy with tamoxifen 20 mg daily times 5 years  Tamoxifen counseling: We discussed the risks and benefits of tamoxifen. These include but not limited to insomnia, hot flashes, mood changes, vaginal dryness, and weight gain. Although rare, serious side effects including endometrial cancer, risk of blood clots were also discussed. We strongly believe that  the benefits far outweigh the risks. Patient understands these risks and consented to starting treatment. Planned treatment duration is 5 years.  Return to clinic in 3 months for survivorship care plan visit    I spent 25 minutes talking to the patient of which more than half was spent in counseling and coordination of care.  No orders of the defined types were placed in this encounter.  The patient has a good understanding of the overall plan. she agrees with it. she will call with any problems that may develop before the next visit here.   Harriette Ohara, MD 09/26/17

## 2017-09-26 NOTE — Telephone Encounter (Signed)
Gave avs and calendar ° °

## 2017-09-27 ENCOUNTER — Ambulatory Visit
Admission: RE | Admit: 2017-09-27 | Discharge: 2017-09-27 | Disposition: A | Discharge status: Home / Self Care | Payer: Managed Care, Other (non HMO) | Source: Ambulatory Visit | Attending: Radiation Oncology | Admitting: Radiation Oncology

## 2017-09-27 ENCOUNTER — Ambulatory Visit: Payer: Managed Care, Other (non HMO)

## 2017-09-27 DIAGNOSIS — D0511 Intraductal carcinoma in situ of right breast: Secondary | ICD-10-CM | POA: Diagnosis not present

## 2017-09-28 ENCOUNTER — Encounter: Payer: Self-pay | Admitting: Radiation Oncology

## 2017-09-28 ENCOUNTER — Ambulatory Visit
Admission: RE | Admit: 2017-09-28 | Discharge: 2017-09-28 | Disposition: A | Discharge status: Home / Self Care | Payer: Managed Care, Other (non HMO) | Source: Ambulatory Visit | Attending: Radiation Oncology | Admitting: Radiation Oncology

## 2017-09-28 DIAGNOSIS — D0511 Intraductal carcinoma in situ of right breast: Secondary | ICD-10-CM | POA: Diagnosis not present

## 2017-10-02 ENCOUNTER — Encounter: Payer: Self-pay | Admitting: *Deleted

## 2017-10-04 NOTE — Progress Notes (Signed)
  Radiation Oncology         [phone] ________________________________  Name: SHAASIA ODLE MRN: [id number]  Date: 09/28/2017  DOB: 07/01/1971  End of Treatment Note  Diagnosis:   47 y.o. female with Stage [id number], Grade 1, ER/PR positive DCIS of the right breast   Indication for treatment:  Curative       Radiation treatment dates:   08/14/2017 - 09/28/2017  Site/dose:   The patient initially received a dose of 50.4 Gy in 28 fractions to the right breast using whole-breast tangent fields. This was delivered using a 3-D conformal technique. The patient then received a boost to the seroma. This delivered an additional 10 Gy in 5 fractions using a 3-D technique. The total dose was 60.4 Gy.  Narrative: The patient tolerated radiation treatment relatively well.   The patient had some expected skin irritation as she progressed during treatment. Moist desquamation was not present at the end of treatment.  Plan: The patient has completed radiation treatment. The patient will return to radiation oncology clinic for routine followup in one month. I advised the patient to call or return sooner if they have any questions or concerns related to their recovery or treatment. ________________________________  Jodelle Gross, MD, PhD  This document serves as a record of services personally performed by Kyung Rudd, MD. It was created on his behalf by Rae Lips, a trained medical scribe. The creation of this record is based on the scribe's personal observations and the provider's statements to them. This document has been checked and approved by the attending provider.

## 2017-11-06 ENCOUNTER — Encounter: Payer: Self-pay | Admitting: Radiation Oncology

## 2017-11-06 ENCOUNTER — Ambulatory Visit
Admission: RE | Admit: 2017-11-06 | Discharge: 2017-11-06 | Disposition: A | Discharge status: Home / Self Care | Payer: Managed Care, Other (non HMO) | Source: Ambulatory Visit | Attending: Radiation Oncology | Admitting: Radiation Oncology

## 2017-11-06 ENCOUNTER — Other Ambulatory Visit: Payer: Self-pay

## 2017-11-06 VITALS — BP 136/86 | HR 98 | Temp 98.2°F | Resp 18 | Ht <= 58 in | Wt 136.6 lb

## 2017-11-06 DIAGNOSIS — Z79899 Other long term (current) drug therapy: Secondary | ICD-10-CM | POA: Insufficient documentation

## 2017-11-06 DIAGNOSIS — D0511 Intraductal carcinoma in situ of right breast: Secondary | ICD-10-CM | POA: Insufficient documentation

## 2017-11-06 DIAGNOSIS — R5383 Other fatigue: Secondary | ICD-10-CM | POA: Diagnosis not present

## 2017-11-06 DIAGNOSIS — Z170 Estrogen receptor positive status [ER+]: Secondary | ICD-10-CM | POA: Insufficient documentation

## 2017-11-06 DIAGNOSIS — Z79810 Long term (current) use of selective estrogen receptor modulators (SERMs): Secondary | ICD-10-CM | POA: Diagnosis not present

## 2017-11-06 DIAGNOSIS — Z923 Personal history of irradiation: Secondary | ICD-10-CM | POA: Insufficient documentation

## 2017-11-06 NOTE — Progress Notes (Signed)
Radiation Oncology         [phone] ________________________________  Name: Rose [name] MRN: [id number]  Date of Service: 11/06/2017  DOB: 02-15-1971  Post Treatment Note  CC: Merrilee Seashore, MD  Nicholas Lose, MD  Diagnosis: Grade 1, ER/PR positive DCIS of the right breast    Interval Since Last Radiation:  6 weeks   08/14/2017 - 09/28/2017: The patient initially received a dose of 50.4 Gy in 28 fractions to the right breast using whole-breast tangent fields. This was delivered using a 3-D conformal technique. The patient then received a boost to the seroma. This delivered an additional 10 Gy in 5 fractions using a 3-D technique. The total dose was 60.4 Gy.   Narrative:  The patient returns today for routine follow-up. During treatment she did very well with radiotherapy and did not have significant desquamation.                             On review of systems, the patient states she's doing great overall. She reports some mild fatigue and persistent hyperpigmentation of the chest wall. No other complaints are noted.  ALLERGIES:  has No Known Allergies.  Meds: Current Outpatient Medications  Medication Sig Dispense Refill  . acetaminophen (TYLENOL) 500 MG tablet Take 650 mg by mouth every 6 (six) hours as needed.     . clindamycin-tretinoin (ZIANA) gel Apply topically daily.      . Dapsone (ACZONE) 5 % topical gel Apply topically 2 (two) times daily.    . fish oil-omega-3 fatty acids 1000 MG capsule Take 1,000 mg by mouth 2 (two) times daily.      . hydroxychloroquine (PLAQUENIL) 200 MG tablet Take by mouth 2 (two) times daily.      . Multiple Vitamin (ONCE DAILY PO) Take by mouth 1 dose over 24 hours.      . predniSONE (DELTASONE) 10 MG tablet Take 10 mg by mouth daily with breakfast.    . Prenatal Vit-Fe Fumarate-FA (PNV PRENATAL PLUS MULTIVITAMIN) 27-1 MG TABS TAKE 1 TABLET EVERY DAY 30 tablet 11  . tamoxifen (NOLVADEX) 20 MG tablet Take 1 tablet (20 mg  total) daily by mouth. 90 tablet 3  . fluticasone (FLONASE) 50 MCG/ACT nasal spray USE 2 SPRAYS IN EACH NOSTRIL ONCE DAILY AS NEEDED 30 DAYS  6  . HYDROcodone-acetaminophen (NORCO) 5-325 MG tablet 1-2 tabs po q6 hours prn pain (Patient not taking: Reported on 11/06/2017) 20 tablet 0  . methocarbamol (ROBAXIN) 500 MG tablet Take 1 tablet (500 mg total) by mouth at bedtime as needed for muscle spasms. (Patient not taking: Reported on 08/01/2017) 12 tablet 0  . valACYclovir (VALTREX) 1000 MG tablet TAKE 1/2 TABLET BY MOUTH DAILY (Patient not taking: Reported on 08/01/2017) 30 tablet 2   No current facility-administered medications for this encounter.     Physical Findings:  height is 5" (0.127 m) (abnormal) and weight is 136 lb 9.6 oz (62 kg). Her oral temperature is 98.2 F (36.8 C). Her blood pressure is 136/86 and her pulse is 98. Her respiration is 18 and oxygen saturation is 98%.  Pain Assessment Pain Score: 0-No pain/10 In general this is a well appearing African American female in no acute distress. She's alert and oriented x4 and appropriate throughout the examination. Cardiopulmonary assessment is negative for acute distress and she exhibits normal effort. The right breast was examined and reveals hyperpigmentation without desquamation.   Lab Findings: Lab  Results  Component Value Date   WBC 8.4 06/27/2017   HGB 13.4 06/27/2017   HCT 40.8 06/27/2017   MCV 89.5 06/27/2017   PLT 207 06/27/2017     Radiographic Findings: No results found.  Impression/Plan: 1. Grade 1, ER/PR positive DCIS of the right breast. The patient has been doing well since completion of radiotherapy. We discussed that we would be happy to continue to follow her as needed, but she will also continue to follow up with Dr. Lindi Adie in medical oncology. She was counseled on skin care as well as measures to avoid sun exposure to this area.  2. Survivorship. We discussed the importance of survivorship evaluation and she  is is currently scheduled for this in the near future. She was also given the monthly calendar for access to resources offered within the cancer center.     Carola Rhine, PAC

## 2017-12-12 ENCOUNTER — Other Ambulatory Visit: Payer: Self-pay

## 2017-12-12 ENCOUNTER — Encounter: Payer: Self-pay | Admitting: Obstetrics & Gynecology

## 2017-12-12 ENCOUNTER — Encounter

## 2017-12-12 ENCOUNTER — Ambulatory Visit: Payer: Managed Care, Other (non HMO) | Admitting: Obstetrics & Gynecology

## 2017-12-12 ENCOUNTER — Other Ambulatory Visit (HOSPITAL_COMMUNITY)
Admission: RE | Admit: 2017-12-12 | Discharge: 2017-12-12 | Disposition: A | Discharge status: Home / Self Care | Payer: Managed Care, Other (non HMO) | Source: Ambulatory Visit | Attending: Obstetrics & Gynecology | Admitting: Obstetrics & Gynecology

## 2017-12-12 VITALS — BP 126/86 | HR 76 | Resp 16 | Ht 61.0 in | Wt 135.2 lb

## 2017-12-12 DIAGNOSIS — Z124 Encounter for screening for malignant neoplasm of cervix: Secondary | ICD-10-CM

## 2017-12-12 DIAGNOSIS — Z01419 Encounter for gynecological examination (general) (routine) without abnormal findings: Secondary | ICD-10-CM | POA: Diagnosis not present

## 2017-12-12 DIAGNOSIS — Z1211 Encounter for screening for malignant neoplasm of colon: Secondary | ICD-10-CM | POA: Diagnosis not present

## 2017-12-12 NOTE — Progress Notes (Signed)
47 y.o. G0P0 MarriedAfrican AmericanF here for annual exam.  Doing well.  Had DCIS of breast last year.  Had lumpectomy and radiation x 6 weeks.  On tamoxifen now for 5 years.  Husband was diagnosed with breast cancer.  He has a mastectomy and a +lymph node.  He's just finished with chemotherapy.  He will then have radiation.    Cycles are irregular.  Does still have cramping.  Bleeding is dark and heavy for about a day and a half.  She will then have some spotting for about 3-4  more days.  Mother is in the hospital right now.  Has severe anemia.    PCP:  Dr. Rogelia Mire.  Blood work as all normal in the fall.    Patient's last menstrual period was 11/15/2017 (approximate).          Sexually active: No.  The current method of family planning is abstinence.    Exercising: Yes.    Cardio, strength training, walking, boot camp, hand weights Smoker:  no  Health Maintenance: Pap:  09/13/16 Neg. HR HPV:neg   05/19/15 Neg  History of abnormal Pap:  yes MMG:  05/08/17 DCIS with calcifications right breast  Colonoscopy:  Never BMD: 2009 Normal  TDaP:  09/2016 Pneumonia vaccine(s):  N/a Shingrix:   n/a Hep C testing: n/a Screening Labs: PCP   reports that she has never smoked. She has never used smokeless tobacco. She reports that she drinks about 3.0 - 3.6 oz of alcohol per week. She reports that she does not use drugs.  Past Medical History:  Diagnosis Date  . Abdominal pain   . Breast cancer ([place]) 05/17/2017   Right breast  . Chronic rheumatic arthritis ([place])   . Family history of breast cancer   . Family history of colon cancer   . Family history of pancreatic cancer   . Malignant neoplasm of right breast in female, estrogen receptor positive ([place]) 05/25/2017  . N&V (nausea and vomiting)   . PONV (postoperative nausea and vomiting)   . Poor appetite     Past Surgical History:  Procedure Laterality Date  . APPENDECTOMY  4/12  . BREAST LUMPECTOMY WITH RADIOACTIVE SEED  LOCALIZATION Right 06/27/2017   Procedure: RIGHT BREAST LUMPECTOMY WITH RADIOACTIVE SEED LOCALIZATION;  Surgeon: Erroll Luna, MD;  Location: [place];  Service: General;  Laterality: Right;  . COLPOSCOPY  1999  . HYSTEROSCOPY WITH RESECTOSCOPE  11/10  . MEDIAL COLLATERAL LIGAMENT REPAIR, KNEE Left 05/04/2017   Procedure: LEFT THUMB  COLLATERAL LIGAMENT REPAIR;  Surgeon: Leanora Cover, MD;  Location: [place];  Service: Orthopedics;  Laterality: Left;    Current Outpatient Medications  Medication Sig Dispense Refill  . acetaminophen (TYLENOL) 500 MG tablet Take 650 mg by mouth every 6 (six) hours as needed.     . clindamycin-tretinoin (ZIANA) gel Apply topically daily.      . Dapsone (ACZONE) 5 % topical gel Apply topically 2 (two) times daily.    . fish oil-omega-3 fatty acids 1000 MG capsule Take 1,000 mg by mouth 2 (two) times daily.      . fluticasone (FLONASE) 50 MCG/ACT nasal spray USE 2 SPRAYS IN EACH NOSTRIL ONCE DAILY AS NEEDED 30 DAYS  6  . HYDROcodone-acetaminophen (NORCO) 5-325 MG tablet 1-2 tabs po q6 hours prn pain 20 tablet 0  . hydroxychloroquine (PLAQUENIL) 200 MG tablet Take by mouth 2 (two) times daily.      . methocarbamol (ROBAXIN) 500 MG tablet  Take 1 tablet (500 mg total) by mouth at bedtime as needed for muscle spasms. 12 tablet 0  . Multiple Vitamin (ONCE DAILY PO) Take by mouth 1 dose over 24 hours.      . predniSONE (DELTASONE) 10 MG tablet Take 10 mg by mouth daily with breakfast.    . Prenatal Vit-Fe Fumarate-FA (PNV PRENATAL PLUS MULTIVITAMIN) 27-1 MG TABS TAKE 1 TABLET EVERY DAY 30 tablet 11  . tamoxifen (NOLVADEX) 20 MG tablet Take 1 tablet (20 mg total) daily by mouth. 90 tablet 3  . valACYclovir (VALTREX) 1000 MG tablet TAKE 1/2 TABLET BY MOUTH DAILY 30 tablet 2   No current facility-administered medications for this visit.     Family History  Problem Relation Age of Onset  . Breast cancer Maternal Aunt        70's   . Pancreatic cancer Maternal Aunt 63  . Colon cancer Other        PGF's sister    Review of Systems  All other systems reviewed and are negative.   Exam:   BP 126/86 (BP Location: Left Arm, Patient Position: Sitting, Cuff Size: Large)   Pulse 76   Resp 16   Ht 5\' 1"  (1.549 m)   Wt 135 lb 3.2 oz (61.3 kg)   LMP 11/15/2017 (Approximate)   BMI 25.55 kg/m   Height: 5\' 1"  (154.9 cm)  Ht Readings from Last 3 Encounters:  12/12/17 5\' 1"  (1.549 m)  11/06/17 (!) 5" (0.127 m)  09/26/17 5' (1.524 m)    General appearance: alert, cooperative and appears stated age Head: Normocephalic, without obvious abnormality, atraumatic Neck: no adenopathy, supple, symmetrical, trachea midline and thyroid normal to inspection and palpation Lungs: clear to auscultation bilaterally Breasts: normal appearance, no masses or tenderness Heart: regular rate and rhythm Abdomen: soft, non-tender; bowel sounds normal; no masses,  no organomegaly Extremities: extremities normal, atraumatic, no cyanosis or edema Skin: Skin color, texture, turgor normal. No rashes or lesions Lymph nodes: Cervical, supraclavicular, and axillary nodes normal. No abnormal inguinal nodes palpated Neurologic: Grossly normal   Pelvic: External genitalia:  no lesions              Urethra:  normal appearing urethra with no masses, tenderness or lesions              Bartholins and Skenes: normal                 Vagina: normal appearing vagina with normal color and discharge, no lesions              Cervix: no lesions              Pap taken: Yes.   Bimanual Exam:  Uterus:  enlarged, 8-10 weeks size              Adnexa: normal adnexa and no mass, fullness, tenderness               Rectovaginal: Confirms               Anus:  normal sphincter tone, no lesions  Chaperone was present for exam.  A:  Well Woman with normal exam H/O RA Uterine fibroids DCIS, s/p lumpectomy and radiation, now on Tamoxifen (now off  micronor) Rheumatoid arthritis H/O HSV 2  P:   Mammogram guidelines reviewed pap smear obtained today.  HR HPV neg 2018.   Does not need rx for vicodin this year IFOB given Lab work has been done  within the past year return annually or prn

## 2017-12-14 LAB — CYTOLOGY - PAP: Diagnosis: NEGATIVE

## 2017-12-20 ENCOUNTER — Telehealth: Payer: Self-pay

## 2017-12-20 NOTE — Telephone Encounter (Signed)
LVM for pt reminding of SCP visit with NP on 12/28/17 at 10 am.  Center number left for call back if needed.

## 2017-12-21 ENCOUNTER — Telehealth: Payer: Self-pay | Admitting: Adult Health

## 2017-12-21 NOTE — Telephone Encounter (Signed)
Returned call to patient to reschedule SCP Visit /  Per 6/12 phone message

## 2017-12-25 LAB — FECAL OCCULT BLOOD, IMMUNOCHEMICAL: FECAL OCCULT BLD: NEGATIVE

## 2017-12-28 ENCOUNTER — Encounter: Payer: Managed Care, Other (non HMO) | Admitting: Adult Health

## 2018-01-05 ENCOUNTER — Encounter: Payer: Self-pay | Admitting: Adult Health

## 2018-01-05 ENCOUNTER — Telehealth: Payer: Self-pay | Admitting: Hematology and Oncology

## 2018-01-05 ENCOUNTER — Inpatient Hospital Stay: Payer: Managed Care, Other (non HMO) | Attending: Adult Health | Admitting: Adult Health

## 2018-01-05 VITALS — BP 139/85 | HR 93 | Temp 98.7°F | Resp 17 | Ht 61.0 in | Wt 132.7 lb

## 2018-01-05 DIAGNOSIS — Z923 Personal history of irradiation: Secondary | ICD-10-CM | POA: Insufficient documentation

## 2018-01-05 DIAGNOSIS — Z170 Estrogen receptor positive status [ER+]: Secondary | ICD-10-CM | POA: Insufficient documentation

## 2018-01-05 DIAGNOSIS — Z733 Stress, not elsewhere classified: Secondary | ICD-10-CM | POA: Insufficient documentation

## 2018-01-05 DIAGNOSIS — D0511 Intraductal carcinoma in situ of right breast: Secondary | ICD-10-CM | POA: Diagnosis not present

## 2018-01-05 DIAGNOSIS — Z79810 Long term (current) use of selective estrogen receptor modulators (SERMs): Secondary | ICD-10-CM | POA: Diagnosis not present

## 2018-01-05 NOTE — Telephone Encounter (Signed)
Gave avs and calendar ° °

## 2018-01-05 NOTE — Progress Notes (Signed)
CLINIC:  Survivorship   REASON FOR VISIT:  Routine follow-up post-treatment for a recent history of breast cancer.  BRIEF ONCOLOGIC HISTORY:    Ductal carcinoma in situ (DCIS) of right breast   04/20/2017 Breast MRI    Right breast middle to posterior depth 1.3 x 0.8 x 0.9 cm suspicious mass spiculated mass, several 4-5 mm enhancing foci, multiple bilateral enhancing foci that are less than 5 mm      05/08/2017 Initial Diagnosis    Right breast biopsy 830 position: DCIS with calcifications, low-grade, ER 95%, PR 90%, Tis N0 stage 0; right breast 10:00:PASH      05/25/2017 -  Anti-estrogen oral therapy    Tamoxifen 20 mg daily      06/19/2017 Genetic Testing    BRIP1 c.3103C>T (p.Arg1035Cys), MSH6 c.2667G>T (p.Gln889His) and NBN c.1684G>A (p.Val562Ile) VUSs found on the comprehensive Common Cancer panel.  The Comprehensive Common Cancer Panel offered by [facility] includes sequencing and/or deletion duplication testing of the following 46 genes: APC, ATM, AXIN2, BAP1, BARD1, BMPR1A, BRCA1, BRCA2, BRIP1, CDH1, CDK4, CDKN2A, CHEK2, EPCAM, FANCC, FH, FLCN, HOXB13, MET, MITF,  MLH1, MSH2, MSH6, MUTYH, NBN, NF1, NTHL1,  PALB2, PMS2, POLD1, POLE, POT1, PTEN, RAD51C, RAD51D, RECQL, SCG5/GREM1, SDHB, SDHC, SDHD, SMAD4, STK11, TP53, TSC1, TSC2, and VHL.   The report date is June 19, 2017.       06/27/2017 Surgery    Rt Lumpectomy: DCIS grade 1 spanning 0.3 cm, margins Neg, Intra ductal papilloma, UDH, CSL, Tis Nx Stage 0      08/14/2017 - 09/26/2017 Radiation Therapy    Adjuvant radiation therapy       INTERVAL HISTORY:  [name] presents to the [facility] today for our initial meeting to review her survivorship care plan detailing her treatment course for breast cancer, as well as monitoring long-term side effects of that treatment, education regarding health maintenance, screening, and overall wellness and health promotion.     Overall, Rose [name] reports feeling  quite well.  She is taking Tamoxifen daily.  She is taking 15m daily since C[place] prone to uterine polyps.  She hasn't felt any issues and she feels her therapy is going well.  She says hot flashes she was experiencing prior, have continued but are unchanged.  In regards to her menstrual cycle, she is off schedule and has some spotting, she also notes that her flow is irregular.    She sees Dr. MSabra Heckregularly.  She has close follow up with her.    CK[place]as intermittent hives that she will get these intermittently, on her skin and they have not really gone away. She noted these hives last night.  She notes that she had hives after surgery, and then noticed them last night.      REVIEW OF SYSTEMS:  Review of Systems  Constitutional: Negative for appetite change, chills, fatigue, fever and unexpected weight change.  HENT:   Negative for hearing loss, lump/mass and trouble swallowing.   Eyes: Negative for eye problems and icterus.  Respiratory: Negative for chest tightness, cough and shortness of breath.   Cardiovascular: Negative for chest pain, leg swelling and palpitations.  Gastrointestinal: Negative for abdominal distention, constipation, diarrhea, nausea and vomiting.  Endocrine: Negative for hot flashes.  Skin: Positive for rash.  Neurological: Negative for dizziness, extremity weakness, headaches and numbness.  Hematological: Negative for adenopathy. Does not bruise/bleed easily.  Psychiatric/Behavioral: Negative for depression. The patient is not nervous/anxious.   Breast: Denies any new nodularity, masses,  tenderness, nipple changes, or nipple discharge.      ONCOLOGY TREATMENT TEAM:  1. Surgeon:  Dr. Brantley Stage at [facility] 2. Medical Oncologist: Dr. Lindi Adie  3. Radiation Oncologist: Dr. Lisbeth Renshaw    PAST MEDICAL/SURGICAL HISTORY:  Past Medical History:  Diagnosis Date  . Abdominal pain   . Breast cancer ([place]) 05/17/2017   Right breast  . Chronic  rheumatic arthritis ([place])   . Family history of breast cancer   . Family history of colon cancer   . Family history of pancreatic cancer   . Malignant neoplasm of right breast in female, estrogen receptor positive ([place]) 05/25/2017  . N&V (nausea and vomiting)   . PONV (postoperative nausea and vomiting)   . Poor appetite    Past Surgical History:  Procedure Laterality Date  . APPENDECTOMY  4/12  . BREAST LUMPECTOMY WITH RADIOACTIVE SEED LOCALIZATION Right 06/27/2017   Procedure: RIGHT BREAST LUMPECTOMY WITH RADIOACTIVE SEED LOCALIZATION;  Surgeon: Erroll Luna, MD;  Location: [place];  Service: General;  Laterality: Right;  . COLPOSCOPY  1999  . HYSTEROSCOPY WITH RESECTOSCOPE  11/10  . MEDIAL COLLATERAL LIGAMENT REPAIR, KNEE Left 05/04/2017   Procedure: LEFT THUMB  COLLATERAL LIGAMENT REPAIR;  Surgeon: Leanora Cover, MD;  Location: [place];  Service: Orthopedics;  Laterality: Left;     ALLERGIES:  No Known Allergies   CURRENT MEDICATIONS:  Outpatient Encounter Medications as of 01/05/2018  Medication Sig Note  . acetaminophen (TYLENOL) 500 MG tablet Take 650 mg by mouth every 6 (six) hours as needed.    Marland Kitchen alcohol-propylene glycol (AQUAMED) LOTN Apply topically as needed.   . Dapsone (ACZONE) 5 % topical gel Apply topically daily.    . fish oil-omega-3 fatty acids 1000 MG capsule Take 1,000 mg by mouth 2 (two) times daily.     . fluticasone (FLONASE) 50 MCG/ACT nasal spray USE 2 SPRAYS IN EACH NOSTRIL ONCE DAILY AS NEEDED 30 DAYS 11/26/2015: Received from: External Pharmacy  . HYDROcodone-acetaminophen (NORCO) 5-325 MG tablet 1-2 tabs po q6 hours prn pain   . hydroxychloroquine (PLAQUENIL) 200 MG tablet Take by mouth 2 (two) times daily.     . methocarbamol (ROBAXIN) 500 MG tablet Take 1 tablet (500 mg total) by mouth at bedtime as needed for muscle spasms.   . Multiple Vitamin (ONCE DAILY PO) Take by mouth 1 dose over 24 hours.     . predniSONE  (DELTASONE) 10 MG tablet Take 10 mg by mouth daily with breakfast.   . Prenatal Vit-Fe Fumarate-FA (PNV PRENATAL PLUS MULTIVITAMIN) 27-1 MG TABS TAKE 1 TABLET EVERY DAY   . tamoxifen (NOLVADEX) 20 MG tablet Take 1 tablet (20 mg total) daily by mouth.   . [DISCONTINUED] clindamycin-tretinoin (ZIANA) gel Apply topically daily.     . valACYclovir (VALTREX) 1000 MG tablet TAKE 1/2 TABLET BY MOUTH DAILY (Patient not taking: Reported on 01/05/2018)    No facility-administered encounter medications on file as of 01/05/2018.      ONCOLOGIC FAMILY HISTORY:  Family History  Problem Relation Age of Onset  . Breast cancer Maternal Aunt        70's  . Pancreatic cancer Maternal Aunt 63  . Multiple myeloma Mother   . Colon cancer Other        PGF's sister     GENETIC COUNSELING/TESTING: negative   SOCIAL HISTORY:  Social History   Socioeconomic History  . Marital status: Married    Spouse name: Not on file  .  Number of children: Not on file  . Years of education: Not on file  . Highest education level: Not on file  Occupational History  . Not on file  Social Needs  . Financial resource strain: Not on file  . Food insecurity:    Worry: Not on file    Inability: Not on file  . Transportation needs:    Medical: Not on file    Non-medical: Not on file  Tobacco Use  . Smoking status: Never Smoker  . Smokeless tobacco: Never Used  Substance and Sexual Activity  . Alcohol use: Yes    Alcohol/week: 3.0 - 3.6 oz    Types: 5 - 6 Standard drinks or equivalent per week    Comment: wine socially  . Drug use: No  . Sexual activity: Not Currently    Partners: Male  Lifestyle  . Physical activity:    Days per week: Not on file    Minutes per session: Not on file  . Stress: Not on file  Relationships  . Social connections:    Talks on phone: Not on file    Gets together: Not on file    Attends religious service: Not on file    Active member of club or organization: Not on file     Attends meetings of clubs or organizations: Not on file    Relationship status: Not on file  . Intimate partner violence:    Fear of current or ex partner: Not on file    Emotionally abused: Not on file    Physically abused: Not on file    Forced sexual activity: Not on file  Other Topics Concern  . Not on file  Social History Narrative  . Not on file      PHYSICAL EXAMINATION:  Vital Signs:   Vitals:   01/05/18 1348  BP: 139/85  Pulse: 93  Resp: 17  Temp: 98.7 F (37.1 C)  SpO2: 100%   Filed Weights   01/05/18 1348  Weight: 132 lb 11.2 oz (60.2 kg)   General: Well-nourished, well-appearing female in no acute distress.  She is unaccompanied today.   HEENT: Head is normocephalic.  Pupils equal and reactive to light. Conjunctivae clear without exudate.  Sclerae anicteric. Oral mucosa is pink, moist.  Oropharynx is pink without lesions or erythema.  Lymph: No cervical, supraclavicular, or infraclavicular lymphadenopathy noted on palpation.  Cardiovascular: Regular rate and rhythm.Marland Kitchen Respiratory: Clear to auscultation bilaterally. Chest expansion symmetric; breathing non-labored.  Breasts: right breast s/p lumpectomy and radiation, no nodularity noted, no masses.  Left breast without nodules, masses, skin or nipple changes GI: Abdomen soft and round; non-tender, non-distended. Bowel sounds normoactive.  GU: Deferred.  Neuro: No focal deficits. Steady gait.  Psych: Mood and affect normal and appropriate for situation.  Extremities: No edema. MSK: No focal spinal tenderness to palpation.  Full range of motion in bilateral upper extremities Skin: Warm and dry.  LABORATORY DATA:  None for this visit.  DIAGNOSTIC IMAGING:  None for this visit.      ASSESSMENT AND PLAN:  Ms.. Rose [name] is a pleasant 47 y.o. female with Stage 0 right breast DCIS, ER+/PR+/HER2-, diagnosed in 04/2017, treated with lumpectomy, adjuvant radiation therapy, and anti-estrogen therapy with  Tamoxifen beginning in 05/2017.  She presents to the Survivorship Clinic for our initial meeting and routine follow-up post-completion of treatment for breast cancer.    1. Stage 0 right breast cancer:  [name] is continuing to recover from definitive treatment  for breast cancer. She will follow-up with her medical oncologist, Dr. Lindi Adie in 6 months with history and physical exam per surveillance protocol.  She will continue her anti-estrogen therapy with Tamoxifen. Thus far, she is tolerating the Tamoxifen well, with minimal side effects. Today, a comprehensive survivorship care plan and treatment summary was reviewed with the patient today detailing her breast cancer diagnosis, treatment course, potential late/long-term effects of treatment, appropriate follow-up care with recommendations for the future, and patient education resources.  A copy of this summary, along with a letter will be sent to the patient's primary care provider via mail/fax/In Basket message after today's visit.    2.  Stress: Jomayra has a large amount of stress due to her and her husbands diagnosis, along with her mom and her recent multiple myeloma diagnosis.  She requested someone to talk to and after discussion,I reached out to Lorrin Jackson, our chaplain.    3. Bone health:  Given [name]'s history of breast cancer, she is at slight risk for bone demineralization.  I counseled her that Tamoxifen likely has a protective effect on the bones.  She was given education on specific activities to promote bone health.  4. Cancer screening:  Due to [name]'s history and her age, she should receive screening for skin cancers, colon cancer, and gynecologic cancers.  The information and recommendations are listed on the patient's comprehensive care plan/treatment summary and were reviewed in detail with the patient.    5. Health maintenance and wellness promotion: [name] was encouraged to consume 5-7  servings of fruits and vegetables per day. We reviewed the "Nutrition Rainbow" handout, as well as the handout "Take Control of Your Health and Reduce Your Cancer Risk" from the [place].  She was also encouraged to engage in moderate to vigorous exercise for 30 minutes per day most days of the week. We discussed the [employer] fitness program, which is designed for cancer survivors to help them become more physically fit after cancer treatments.  She was instructed to limit her alcohol consumption and continue to abstain from tobacco use.     6. Support services/counseling: It is not uncommon for this period of the patient's cancer care trajectory to be one of many emotions and stressors.  We discussed an opportunity for her to participate in the next session of [facility] ("Finding Your New Normal") support group series designed for patients after they have completed treatment.   [name] was encouraged to take advantage of our many other support services programs, support groups, and/or counseling in coping with her new life as a cancer survivor after completing anti-cancer treatment.  She was offered support today through active listening and expressive supportive counseling.  She was given information regarding our available services and encouraged to contact me with any questions or for help enrolling in any of our support group/programs.    Dispo:   -Return to cancer center for f/u with Dr. Lindi Adie in 06/2018 -Mammogram due in 03/2018 -Follow up with surgery 01/2018 -She is welcome to return back to the Survivorship Clinic at any time; no additional follow-up needed at this time.  -Consider referral back to survivorship as a long-term survivor for continued surveillance  A total of (30) minutes of face-to-face time was spent with this patient with greater than 50% of that time in counseling and care-coordination.   Gardenia Phlegm, NP Survivorship Program [facility] [phone]   Note: PRIMARY CARE PROVIDER Merrilee Seashore,  MD [phone]

## 2018-01-08 ENCOUNTER — Encounter: Payer: Self-pay | Admitting: General Practice

## 2018-01-08 NOTE — Progress Notes (Signed)
[place] Spiritual Care Note  Received referral from [facility]/NP for emotional support per pt request due to the stress of multiple diagnoses in family. LVM of availability with encouragement to return call.   [place], [place], [facility] Pager [phone] Voicemail [phone]

## 2018-01-16 ENCOUNTER — Other Ambulatory Visit: Payer: Self-pay

## 2018-01-16 NOTE — Telephone Encounter (Signed)
Medication refill request: Valacyclovir HCL 500 mg tab   Last AEX:  12/12/17 Next AEX: 03/22/19 Last MMG (if hormonal medication request): 03/15/17  Bi-rads Category 4 suspicious.  Refill authorized: Please refill if appropriate.

## 2018-01-17 MED ORDER — VALACYCLOVIR HCL 1 G PO TABS: 500.0000 mg | ORAL_TABLET | Freq: Every day | ORAL | 2 refills | Status: DC

## 2018-01-24 ENCOUNTER — Ambulatory Visit
Admission: RE | Admit: 2018-01-24 | Discharge: 2018-01-24 | Disposition: A | Discharge status: Home / Self Care | Payer: Managed Care, Other (non HMO) | Source: Ambulatory Visit | Attending: Adult Health | Admitting: Adult Health

## 2018-01-24 DIAGNOSIS — D0511 Intraductal carcinoma in situ of right breast: Secondary | ICD-10-CM

## 2018-02-12 ENCOUNTER — Other Ambulatory Visit: Payer: Self-pay | Admitting: Obstetrics & Gynecology

## 2018-02-12 NOTE — Telephone Encounter (Signed)
Medication refill request: Prenatal vitamin plus low iron  Last AEX:  12/12/17 Next AEX: 03/22/19 Last MMG (if hormonal medication request): 01/24/18 Bi-rads Category 2 benign  Refill authorized: Please advise

## 2018-02-23 ENCOUNTER — Encounter: Payer: Self-pay | Admitting: General Practice

## 2018-02-23 NOTE — Progress Notes (Signed)
[place] Spiritual Care Note  Received return call from [facility] yesterday. We were able to meet this afternoon for her to share about her distress re her own dx, her husband Corey's current breast ca dx, and her mom's multiple myeloma dx. Provided pastoral presence, empathic listening, and support resources as Adonis Brook processed patient and caregiver distress, family needs and dynamics (mom, dad, and caregiver sister Marzetta Board live in [place]), and coping strategies. She verbalized appreciation for listening and support; having a witness appeared to be meaningful and centering.  Will place referral to [facility]/counseling intern for further support per pt request, and Makynzee knows to contact [facility] as needed/desired as well.   [place], [place], [facility] Pager [phone] Voicemail [phone]

## 2018-03-16 NOTE — Progress Notes (Signed)
[facility] [facility] Counseling Intake Session  The patient presented to session with a calm mood and matching affect. The patient expressed that she is overwhelmed due to her husband and mother both having cancer. The patient shared that she has been supportive and encouraging toward her husband and mother to "keep fighting." The patient reported that she completed her own cancer treatment in August. The patient expressed that she has had little time to take care of herself recently, has struggled to concentrate at work, and she has had difficulty sleeping. The patient reported previously attending counseling at age 47 for depressive symptoms. However, the patient expressed that these symptoms have not been a concern to her recently. Upon assessment, the patient also denied any suicidal or homicidal ideation. The patient shared that she hopes counseling can be a place to "vent" and get support for herself. In future sessions, the counselor and patient will further explore the patient's resources and how they can be utilized for coping and stress reduction.

## 2018-03-23 NOTE — Progress Notes (Signed)
[place] Counseling Session  The patient presented to session with a calm mood and matching affect. The patient expressed that she has set some boundaries around the family and friends that she shares information about her situation with. The patient expressed hopefulness about her husband and mom's health, and she shared that this has aided her own well being. The counselor and patient discussed the patient's self-care and ongoing adjustment. The counselor and patient scheduled another appointment for March 30, 2018.

## 2018-03-30 NOTE — Progress Notes (Signed)
[place] Counseling Session  The patient presented to session with a calm mood and matching affect.   The patient shared that she has been continuing to keep busy between work and social events. The patient expressed that she is looking forward to visiting with her mom next week and seeing a close friend while in [place]. The patient shared that she plans to tell her friend about she and her husband's cancer journey. The patient became tearful when talking about sharing her experience with her friend and others at an upcoming [facility] event. The patient reported that she tends to prefer to focus on a positive perspective. The patient expressed that she feels "new" to opening up, since she often supports others, but less frequently asks for support for herself.  The counselor and patient discussed how the patient's current situation has affected relationships in her life. The counselor and patient also explored the patient's supports. The counselor normalized the patient's experience of still processing and feeling overwhelmed by she and her family's experience with cancer. The counselor and patient scheduled another appointment for 2 pm on September 24.  Doris Cheadle, Counseling Intern [phone]

## 2018-04-03 ENCOUNTER — Encounter: Payer: Self-pay | Admitting: Genetic Counselor

## 2018-04-03 NOTE — Progress Notes (Signed)
[place] Counseling Session  The patient presented to counseling with a calm mood and matching affect.   The patient expressed that she has had a busy week and is preparing for a visit with her mom. The patient shared that she is trying to seek support for herself and practice self-care more often. The patient expressed that similarly to her mom, she often tends to support others over herself. The patient became tearful when discussing how little control she has over her mom's situation.   The counselor provided therapeutic presence and validation of the patient's feelings of overwhelm. The patient and counselor also discussed coping skills that can be utilized over the next week. The patient and counselor will meet again on October 4 at 3 pm.  Doris Cheadle, Counseling Intern [phone]

## 2018-04-13 NOTE — Progress Notes (Signed)
[place] Counseling Session  The patient presented to session with a calm mood and matching affect.  The patient shared that her visit with her mom last week was busy, and as it often does, leaving brought up some feelings of sadness according to the patient. The patient expressed that she often worries about how the people in her life are doing. The patient expressed that she accepts what she cannot control, but she seemed to be challenged by the lack of control she currently feels, as she became tearful when discussing this. The patient shared that a recent high school reunion allowed her to reconnect with old friends also affected by cancer in some way, and this provided her some comfort in knowing that she is not alone.  The patient and counselor explored the patient's tendency to "carry the weight of the world on her shoulders." The patient and counselor also discussed self-care plans for the next week. The patient and counselor will meet again on October 18 at 3 pm.  Doris Cheadle, Counseling Intern [phone]

## 2018-04-27 NOTE — Progress Notes (Signed)
[place] [facility] Counseling Session  The patient presented to session with a calm mood and matching affect.   The patient expressed that she has been feeling "torn" between wanting to spend time with her mom and not wanting to see her suffer. The patient shared that she plans to share her story at a [facility] event in November, and though she worries about getting tearful as she speaks, she hopes to provide a message that conveys the power of taking life "day by day."   The counselor and patient processed the patient's feelings of being "torn" and sharing her story in the next few weeks. The counselor provided therapeutic presence, empathetic reflections, and validation. The patient reported that in future sessions, she would like to learn more about coping skills to manage caregiver stress. The patient and counselor will meet again on October 25 at 83 am.   Doris Cheadle, Counseling Intern [phone]
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
## 2018-05-04 NOTE — Progress Notes (Signed)
[facility] Counseling Session  The patient presented to session with a calm mood and matching affect.  The patient expressed that she has felt increased individual well-being in the last week due to taking some down time to herself. The patient shared that her mom has transitioned into at home care, which has brought up some uncertainty for her and her family, but she is hopeful that her mom has seemed at peace. The patient expressed that she feels peace when the important people in her life are at peace. The patient shared that she reacts to stress inwardly rather than outwardly, so often her worry is not known to others. The patient expressed that a message that has been encouraging to her is the idea of not being alone in the song "We Rise."  The patient and counselor discussed the patient's coping over the last week. The patient and counselor processed changes in the patient's family situation as well. The patient reported that she hopes to begin exercising more often and taking naps in order to take care of herself. The patient and counselor will meet again on November 1 at 3 pm.  Doris Cheadle, Counseling Intern [phone]

## 2018-06-01 NOTE — Progress Notes (Signed)
[facility] Counseling Session  The patient presented with a calm mood and matching affect. The patient was tearful throughout session as she discussed the recent loss of her mom.  The patient expressed that she has gone back to work recently after the loss of her mom. The patient shared that she has welcomed the distraction and return to routine. The patient reported that she has felt relief and peace from knowing that her mom had a peaceful death and life that positively impacted many of the people in her community. The patient shared that she also feels some relief from no longer feeling anxious waiting for calls about how her mom is doing. The patient shared "I am grateful" several times throughout session. The patient reported that she is learning to process her grief in her own way, which has been different from some family members and day to day.  The counselor processed grief and loss with the patient, normalizing and validating the diverse ways that grief can be experienced. The counselor and patient will meet again on December 6 at 2 pm.  Doris Cheadle, Counseling Intern [phone]

## 2018-06-15 NOTE — Progress Notes (Signed)
[facility] Counseling Session  The pt presented to session with a calm mood and matching affect. The pt was engaged and forthcoming throughout session. The pt was tearful at times in session when she spoke about the recent loss of her mom.  The pt shared that over Thanksgiving, she hosted her husband's family, and she was able to establish boundaries in order to have space to grieve or seek comfort on her own. The pt reported that family was understanding of this. The pt described her current stage of life following the loss of her mom as "new." The pt expressed feeling "happy and sad at the same time." She shared that her mom's appreciation of hard work and serving the community has encouraged her to invest in volunteering activities in the coming months.  The pt and counselor processed the pt's thoughts and emotions related the loss of her mom and transition in her life. The pt and counselor plan to meet again on December 18 at 2 pm.   Rose [name], Counseling Intern [phone]

## 2018-06-27 NOTE — Progress Notes (Signed)
[facility] Counseling Session  The pt presented to session with a calm mood and matching affect. The pt was forthcoming and engaged throughout session.  The pt became tearful as she shared that the holidays will be "different" this year. The pt expressed sadness toward not being able to share family holiday traditions with her mom. The pt reported that she will be visiting her dad and sister next week and hopes that during this time she can show them that she wants to be engaged with ongoing arrangements following her mom's death. The pt shared that it is important to her to play a supportive role in her family at this time.  The pt and counselor processed shifts in family roles. The pt identified that she feels internal pressure to "do something" to be helpful to her family during this time, which seems to have contributed to difficulty sleeping in the last 2 weeks per the pt. The counselor offered validation around family transition following loss.  The pt and counselor will meet again on January 2 at 3 pm.  Doris Cheadle, Counseling Intern [phone]

## 2018-06-28 NOTE — Assessment & Plan Note (Signed)
06/27/2018:Rt Lumpectomy: DCIS grade 1 spanning 0.3 cm, margins Neg, Intra ductal papilloma, UDH, CSL, Tis Nx Stage 0 Adjuvant radiation therapy 08/14/2017-09/26/2017  Treatment plan: Adjuvant antiestrogen therapy with tamoxifen 20 mg daily times 5 years started 09/26/17  Tamoxifen Toxicities:  Breast Cancer Surveillance: 1. Breast Exam: 06/28/18: Benign 2. Mammogram: 01/24/18: Benign  RTC in 1 year for follow up

## 2018-06-29 ENCOUNTER — Telehealth: Payer: Self-pay | Admitting: Hematology and Oncology

## 2018-06-29 ENCOUNTER — Inpatient Hospital Stay: Payer: Managed Care, Other (non HMO) | Attending: Hematology and Oncology | Admitting: Hematology and Oncology

## 2018-06-29 DIAGNOSIS — Z923 Personal history of irradiation: Secondary | ICD-10-CM

## 2018-06-29 DIAGNOSIS — Z170 Estrogen receptor positive status [ER+]: Secondary | ICD-10-CM | POA: Diagnosis not present

## 2018-06-29 DIAGNOSIS — Z79899 Other long term (current) drug therapy: Secondary | ICD-10-CM | POA: Diagnosis not present

## 2018-06-29 DIAGNOSIS — D0511 Intraductal carcinoma in situ of right breast: Secondary | ICD-10-CM | POA: Insufficient documentation

## 2018-06-29 DIAGNOSIS — Z79810 Long term (current) use of selective estrogen receptor modulators (SERMs): Secondary | ICD-10-CM | POA: Diagnosis not present

## 2018-06-29 MED ORDER — TAMOXIFEN CITRATE 20 MG PO TABS: 10.0000 mg | ORAL_TABLET | Freq: Every day | ORAL | 3 refills | Status: DC

## 2018-06-29 MED ORDER — VALACYCLOVIR HCL 1 G PO TABS: 500.0000 mg | ORAL_TABLET | ORAL | 2 refills | Status: DC | PRN

## 2018-06-29 MED ORDER — TAMOXIFEN CITRATE 10 MG PO TABS: 10.0000 mg | ORAL_TABLET | Freq: Every day | ORAL | 3 refills | Status: DC

## 2018-06-29 NOTE — Progress Notes (Signed)
Patient Care Team: Merrilee Seashore, MD as PCP - General (Internal Medicine) Nicholas Lose, MD as Consulting Physician (Hematology and Oncology) Erroll Luna, MD as Consulting Physician (General Surgery) Kyung Rudd, MD as Consulting Physician (Radiation Oncology)  DIAGNOSIS:  Encounter Diagnosis  Name Primary?  . Ductal carcinoma in situ (DCIS) of right breast     SUMMARY OF ONCOLOGIC HISTORY:   Ductal carcinoma in situ (DCIS) of right breast   04/20/2017 Breast MRI    Right breast middle to posterior depth 1.3 x 0.8 x 0.9 cm suspicious mass spiculated mass, several 4-5 mm enhancing foci, multiple bilateral enhancing foci that are less than 5 mm    05/08/2017 Initial Diagnosis    Right breast biopsy 830 position: DCIS with calcifications, low-grade, ER 95%, PR 90%, Tis N0 stage 0; right breast 10:00:PASH    05/25/2017 -  Anti-estrogen oral therapy    Tamoxifen 20 mg daily    06/19/2017 Genetic Testing    BRIP1 c.3103C>T (p.Arg1035Cys), MSH6 c.2667G>T (p.Gln889His) and NBN c.1684G>A (p.Val562Ile) VUSs found on the comprehensive Common Cancer panel.  The Comprehensive Common Cancer Panel offered by [facility] includes sequencing and/or deletion duplication testing of the following 46 genes: APC, ATM, AXIN2, BAP1, BARD1, BMPR1A, BRCA1, BRCA2, BRIP1, CDH1, CDK4, CDKN2A, CHEK2, EPCAM, FANCC, FH, FLCN, HOXB13, MET, MITF,  MLH1, MSH2, MSH6, MUTYH, NBN, NF1, NTHL1,  PALB2, PMS2, POLD1, POLE, POT1, PTEN, RAD51C, RAD51D, RECQL, SCG5/GREM1, SDHB, SDHC, SDHD, SMAD4, STK11, TP53, TSC1, TSC2, and VHL.   The report date is June 19, 2017. Update: BRIP1 c.3103C>T VUS has been reclassified from a variant of uncertain significance to a likely benign variant based on a combination of sources, e.g., internal data, published literature, population databases and in [place].  The report date is January 19, 2018.     06/27/2017 Surgery    Rt Lumpectomy: DCIS grade 1 spanning 0.3 cm, margins Neg,  Intra ductal papilloma, UDH, CSL, Tis Nx Stage 0    08/14/2017 - 09/26/2017 Radiation Therapy    Adjuvant radiation therapy    10/28/2017 -  Anti-estrogen oral therapy    Tamoxifen 10 mg daily     CHIEF COMPLIANT: Follow-up on tamoxifen therapy  INTERVAL HISTORY: Rose [name] is a 47 year old with above-mentioned history of DCIS right breast treated with lumpectomy radiation is currently on tamoxifen 10 mg daily.  She appears to be tolerating tamoxifen moderately well.  She denies any major side effects other than occasional hot flashes as well as hair thinning.  She has noticed a rash and itching sensation in the right breast intermittently.  She is using a cortisone cream for it.  REVIEW OF SYSTEMS:   Constitutional: Denies fevers, chills or abnormal weight loss Eyes: Denies blurriness of vision Ears, nose, mouth, throat, and face: Denies mucositis or sore throat Respiratory: Denies cough, dyspnea or wheezes Cardiovascular: Denies palpitation, chest discomfort Gastrointestinal:  Denies nausea, heartburn or change in bowel habits Skin: Denies abnormal skin rashes Lymphatics: Denies new lymphadenopathy or easy bruising Neurological:Denies numbness, tingling or new weaknesses Behavioral/Psych: Mood is stable, no new changes  Extremities: No lower extremity edema Breast: Right breast itching All other systems were reviewed with the patient and are negative.  I have reviewed the past medical history, past surgical history, social history and family history with the patient and they are unchanged from previous note.  ALLERGIES:  has No Known Allergies.  MEDICATIONS:  Current Outpatient Medications  Medication Sig Dispense Refill  . acetaminophen (TYLENOL) 500 MG tablet Take  650 mg by mouth every 6 (six) hours as needed.     Marland Kitchen alcohol-propylene glycol (AQUAMED) LOTN Apply topically as needed.    . Dapsone (ACZONE) 5 % topical gel Apply topically daily.     . fish oil-omega-3  fatty acids 1000 MG capsule Take 1,000 mg by mouth 2 (two) times daily.      . fluticasone (FLONASE) 50 MCG/ACT nasal spray USE 2 SPRAYS IN EACH NOSTRIL ONCE DAILY AS NEEDED 30 DAYS  6  . HYDROcodone-acetaminophen (NORCO) 5-325 MG tablet 1-2 tabs po q6 hours prn pain 20 tablet 0  . hydroxychloroquine (PLAQUENIL) 200 MG tablet Take by mouth 2 (two) times daily.      . methocarbamol (ROBAXIN) 500 MG tablet Take 1 tablet (500 mg total) by mouth at bedtime as needed for muscle spasms. 12 tablet 0  . Multiple Vitamin (ONCE DAILY PO) Take by mouth 1 dose over 24 hours.      . Prenatal Vit-Fe Fumarate-FA (PRENATAL VITAMIN PLUS LOW IRON) 27-1 MG TABS TAKE 1 TABLET EVERY DAY 30 tablet 11  . tamoxifen (NOLVADEX) 10 MG tablet Take 1 tablet (10 mg total) by mouth daily. 90 tablet 3  . valACYclovir (VALTREX) 1000 MG tablet Take 0.5 tablets (500 mg total) by mouth as needed. 30 tablet 2   No current facility-administered medications for this visit.     PHYSICAL EXAMINATION: ECOG PERFORMANCE STATUS: 1 - Symptomatic but completely ambulatory  Vitals:   06/29/18 0838  BP: (!) 131/95  Pulse: 79  Resp: 17  Temp: 98.6 F (37 C)  SpO2: 100%   Filed Weights   06/29/18 0838  Weight: 132 lb 3.2 oz (60 kg)    GENERAL:alert, no distress and comfortable SKIN: skin color, texture, turgor are normal, no rashes or significant lesions EYES: normal, Conjunctiva are pink and non-injected, sclera clear OROPHARYNX:no exudate, no erythema and lips, buccal mucosa, and tongue normal  NECK: supple, thyroid normal size, non-tender, without nodularity LYMPH:  no palpable lymphadenopathy in the cervical, axillary or inguinal LUNGS: clear to auscultation and percussion with normal breathing effort HEART: regular rate & rhythm and no murmurs and no lower extremity edema ABDOMEN:abdomen soft, non-tender and normal bowel sounds MUSCULOSKELETAL:no cyanosis of digits and no clubbing  NEURO: alert & oriented x 3 with fluent  speech, no focal motor/sensory deficits EXTREMITIES: No lower extremity edema   LABORATORY DATA:  I have reviewed the data as listed CMP Latest Ref Rng & Units 06/27/2017 11/04/2010  Glucose 65 - 99 mg/dL 93 90  BUN 6 - 20 mg/dL 9 7  Creatinine 0.44 - 1.00 mg/dL 0.83 0.78  Sodium 135 - 145 mmol/L 140 132(L)  Potassium 3.5 - 5.1 mmol/L 3.4(L) 3.7  Chloride 101 - 111 mmol/L 107 99  CO2 22 - 32 mmol/L 24 23  Calcium 8.9 - 10.3 mg/dL 9.2 9.1  Total Protein 6.5 - 8.1 g/dL 7.0 -  Total Bilirubin 0.3 - 1.2 mg/dL 1.0 -  Alkaline Phos 38 - 126 U/L 29(L) -  AST 15 - 41 U/L 26 -  ALT 14 - 54 U/L 15 -    Lab Results  Component Value Date   WBC 8.4 06/27/2017   HGB 13.4 06/27/2017   HCT 40.8 06/27/2017   MCV 89.5 06/27/2017   PLT 207 06/27/2017   NEUTROABS 2.9 06/27/2017    ASSESSMENT & PLAN:  Ductal carcinoma in situ (DCIS) of right breast 06/27/2018:Rt Lumpectomy: DCIS grade 1 spanning 0.3 cm, margins Neg, Intra ductal papilloma, UDH,  CSL, Tis Nx Stage 0 Adjuvant radiation therapy 08/14/2017-09/26/2017  Treatment plan: Adjuvant antiestrogen therapy with tamoxifen 20 mg daily times 5 years started 09/26/17  Tamoxifen Toxicities: 1.  Hot flashes 2.  Hair thinning  Patient's mom passed away recently and she is still grieving from that and going through counseling. Patient also experiencing leg cramps Rash on the right breast which comes on with stress.  She is using cortisone cream which appears to help.  Breast Cancer Surveillance: 1. Breast Exam: 06/28/18: Benign 2. Mammogram: 01/24/18: Benign  RTC in 1 year for follow up     No orders of the defined types were placed in this encounter.  The patient has a good understanding of the overall plan. she agrees with it. she will call with any problems that may develop before the next visit here.   Harriette Ohara, MD 06/29/18

## 2018-06-29 NOTE — Telephone Encounter (Signed)
Gave avs and calendar ° °

## 2018-07-09 IMAGING — MG 2D DIGITAL DIAGNOSTIC UNILATERAL RIGHT MAMMOGRAM WITH CAD AND AD
6 of 9 series · 6 of 21 positions shown · non-contrast
Comparison: Previous exam(s).

CLINICAL DATA: Six-month follow-up for a probably benign right
breast mass. In addition, the patient has new spontaneous right
nipple discharge for 1 week.

EXAM:
2D DIGITAL DIAGNOSTIC UNILATERAL RIGHT MAMMOGRAM WITH CAD AND
ADJUNCT TOMO
RIGHT BREAST ULTRASOUND

[R MLO (1 of 2)]
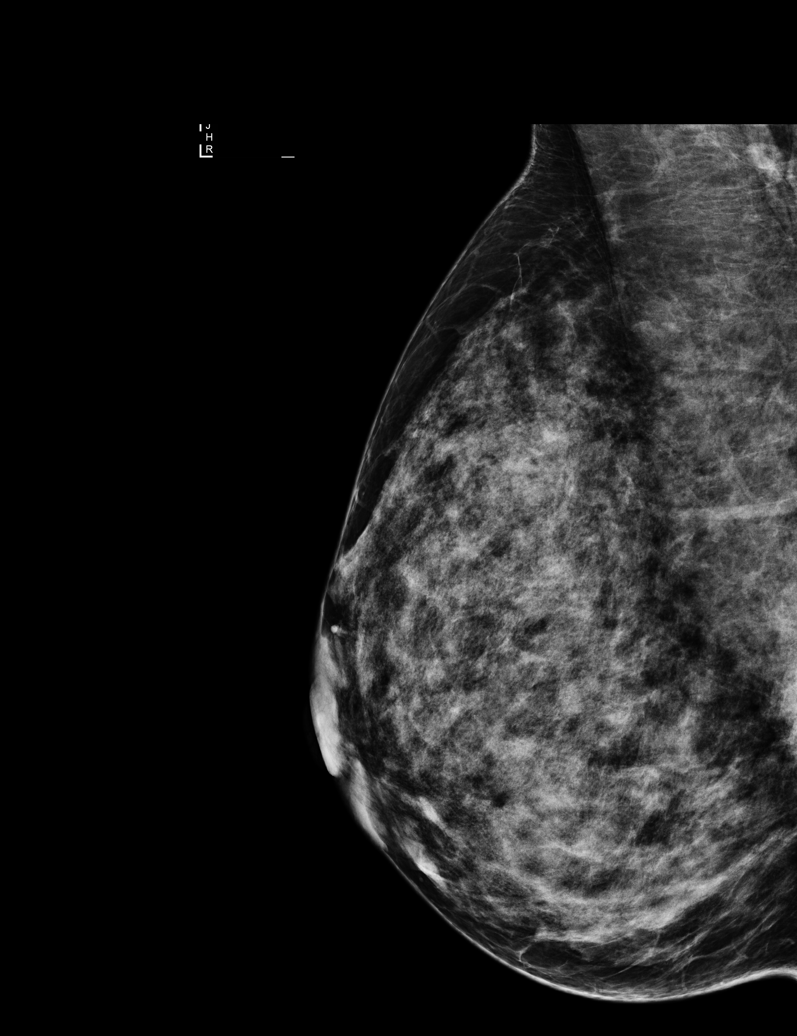

[R CC]
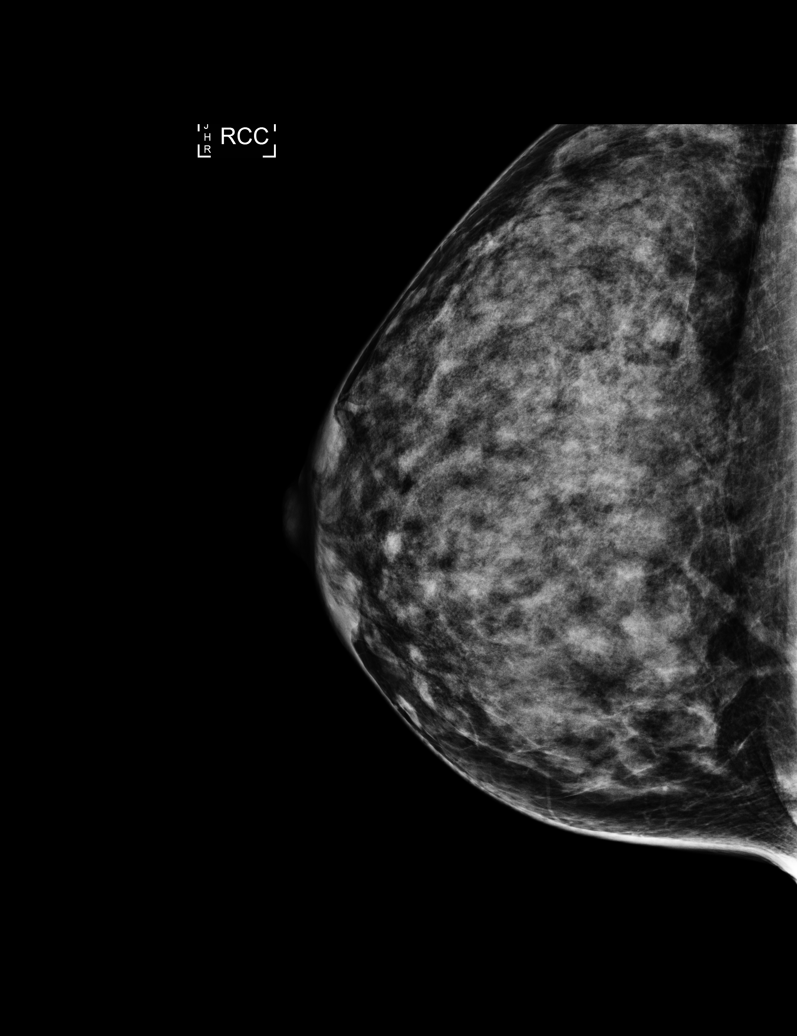

[R CC synth-2D]
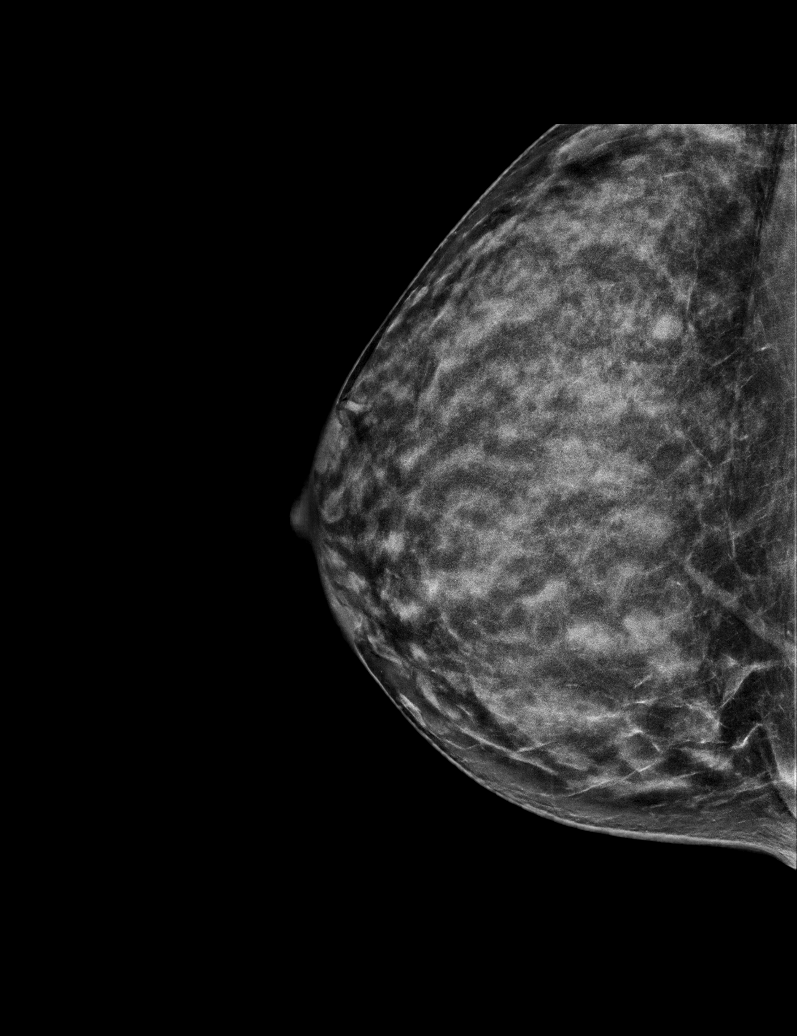

[R MLO synth-2D (1 of 2)]
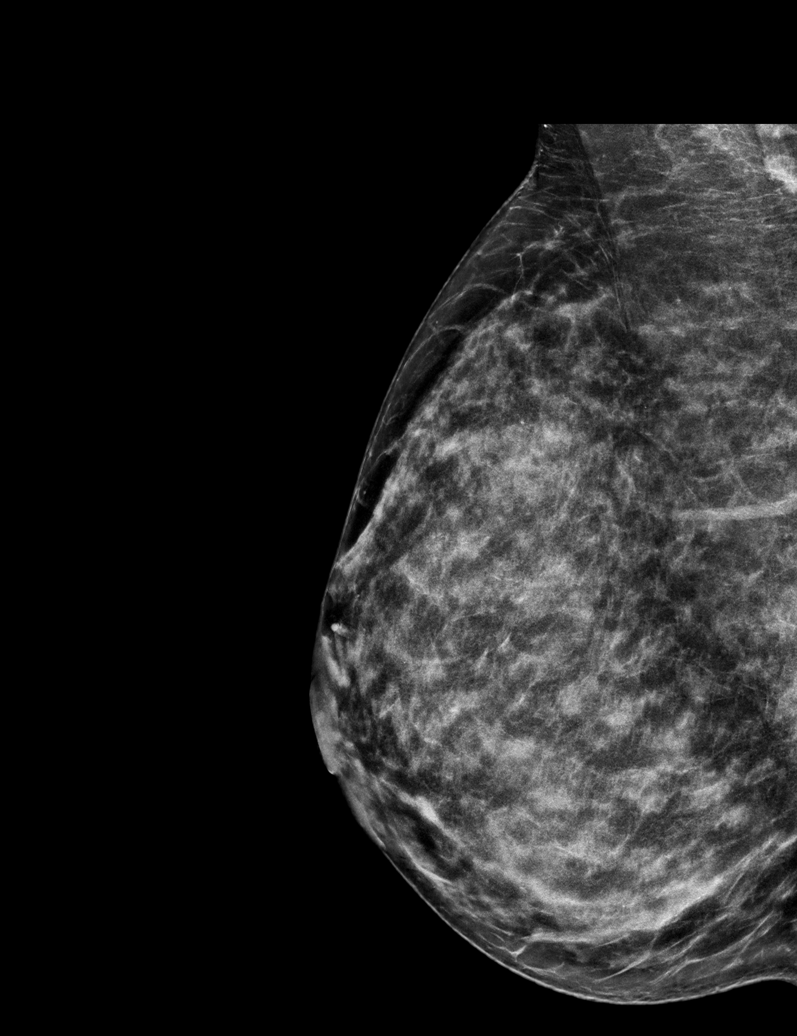

[R MLO (2 of 2)]
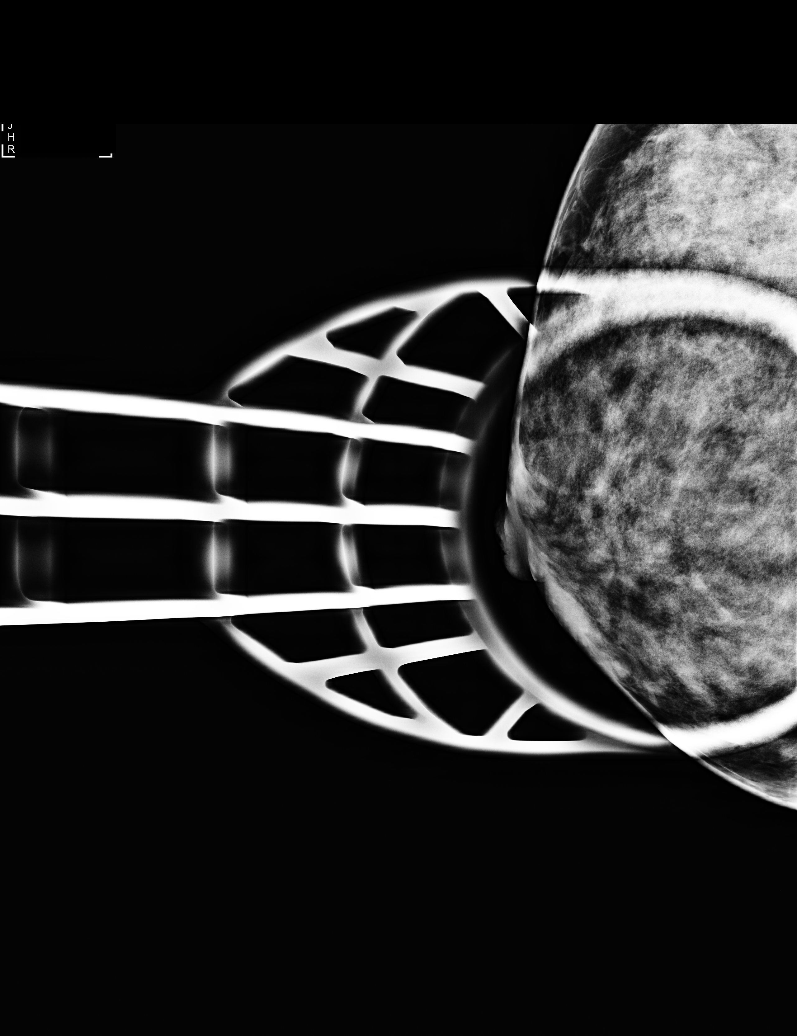

[R MLO synth-2D (2 of 2)]
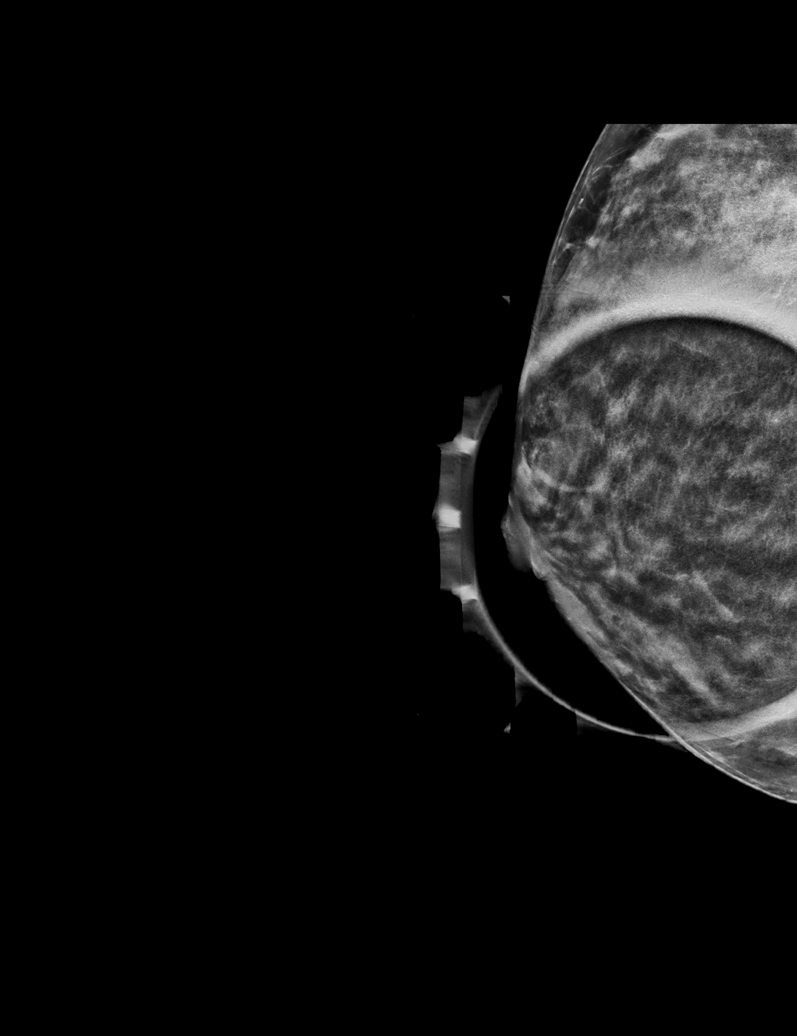

[6 of 21 positions shown; findings below may reference images not displayed]

ACR Breast Density Category d: The breast tissue is extremely dense,
which lowers the sensitivity of mammography.
FINDINGS: No new suspicious calcifications, masses or areas of distortion are
seen in the bilateral breasts.

Mammographic images were processed with CAD.

A small amount of reddish brown discharge could be manually
expressed from 1 duct in the right nipple.

Ultrasound of the right breast at 4 o'clock, 2 cm from the nipple
demonstrates a stable oval hypoechoic mass measuring 6 x 3 x 6 mm,
previously 8 x 4 x 7 mm.
IMPRESSION: Slight decrease in size in the right breast mass at 4 o'clock.
However, considering the new symptom of bloody right nipple
discharge, biopsy is recommended.

RECOMMENDATION:
Ultrasound-guided biopsy is recommended for the right breast mass at
4 o'clock. This has been scheduled for 03/20/2017 at [DATE] p.m..

I did discuss with the patient that if the biopsy results do not
explain patient's right nipple discharge, MRI would be recommended.

I have discussed the findings and recommendations with the patient.
Results were also provided in writing at the conclusion of the
visit. If applicable, a reminder letter will be sent to the patient
regarding the next appointment.

BI-RADS CATEGORY  4: Suspicious.

## 2018-07-20 ENCOUNTER — Other Ambulatory Visit: Payer: Self-pay | Admitting: Obstetrics & Gynecology

## 2018-07-24 NOTE — Progress Notes (Signed)
[facility] Counseling Session  The pt presented to session with a calm mood and matching affect. The pt was forthcoming and engaged throughout session.  The pt shared that she was busy over the holidays due to visiting with family. The ptreported that she felt "the absence" of her mom, but she also was able to connect with her through maintaining family holiday traditions. The pt expressed, "I don't know how to cope," when talking about grieving the loss of her mom. She identified the unknown of the loss of a parent being the source of her uncertainty. The pt also shared that she is concerned about her dad becoming "lonely" and "sad." The pt expressed that after the loss of her mom, she fears losing her dad.  The pt and counselor processed the pt's experiences of grief and loss over the holidays. The counselor normalized diverse experiences of grief over time and across different individuals. The pt and counselor also processed changes in roles in the pt's family.The pt and counselor will meet again on Tuesday, January 14 at 2 pm.  Doris Cheadle, Counseling Intern [phone]

## 2018-07-24 NOTE — Progress Notes (Signed)
[facility] Counseling Session  The pt presented to session with a calm mood and matching affect. The pt was forthcoming and engaged throughout session.  The pt shared that in the last week, she has been trying to practice boundaries. The pt expressed that she has been working to recognize what is within her control and what is not. The pt identified that she is shifting her focus and energy toward her own behavior going forward, as this is what she can control. The pt reported that in the last couple days, she has noticed her stress levels increasing, which has reminded her to create boundaries that prevent her from taking on other loved ones' stress.  The pt and counselor processed the boundaries that the pt has been practicing. The pt and counselor also explored the pt's self-care behaviors. The pt identified going on walks and resting/watching TV at home as her current self-care behaviors. The counselor normalized that self-care can change over time depending on personal needs.  The pt and counselor will meet again on Tuesday, January 21 at 3 pm.  Doris Cheadle, Counseling Intern [phone]

## 2018-07-31 NOTE — Progress Notes (Signed)
[facility] Counseling Session  The pt presented to session with a calm mood and matching affect. The pt was forthcoming and engaged throughout session.  The pt expressed that she has been encouraged to see her dad engaging with family members and friends through visits and phone calls. The pt shared that she feels relief when she knows her dad is supported and taken care of. The pt expressed that recently, she has recognized herself feeling tired. 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 The pt expressed that "I'm trying to take care of myself," and identified that she is engaging in exercise, practicing boundary setting, and allowing herself to rest as needed. The pt shared some fear of others not understanding that her recovery following her cancer treatment is ongoing and multifaceted. The pt shared that reminders of how her mom would like for her to take care of herself provide her affirmation.  The pt and counselor will meet again on Tuesday, January 28 at 3 pm.  Doris Cheadle, Counseling Intern [phone]

## 2018-08-08 NOTE — Progress Notes (Signed)
[facility] Counseling Session   The pt presented to session with a calm mood and matching affect. The pt was forthcoming and engaged throughout session.   The pt expressed that in the last week she has been working on setting personal boundaries around what she puts her energy towards. The pt shared a story about her stepson, and she processed how it has been helpful to recognize that her role as a parental figure can be limited as her stepson is now an adult. The pt expressed that she has several busy months ahead of her between work, hosting family members, and traveling. The pt shared that she defines this spring as a time of "rebirth." The pt expressed that in future sessions she would like to explore what a "new beginning" may look like for her family following loss.   The pt and counselor will meet again on Tuesday, February 4 at 3 pm.   Doris Cheadle, Counseling Intern   [phone]

## 2018-08-14 NOTE — Progress Notes (Signed)
[facility] Counseling Session  The pt presented to session with a calm mood and matching affect. The pt was alert and oriented.  The pt expressed that she is hosting her dad this weekend and looks forward to quality time together. The pt and counselor processed the ways family and quality time look different following the loss of the pt's mom. The pt related family to "being there during good times and bad times" and being authentic with one another. The pt shared that she is seeking to "clean out and make new," which she described as physically organizing her spaces at work, home, and her dad's house to be more comfortable. The pt seemed to reflect on the theme of renewal/rebirth that she brought up in the previous session.  The pt and counselor processed the ways the pt is seeking renewal and "new beginnings." The pt identified that in the next session she would like to focus on coping/adjustment after loss.  The pt and counselor will meet again on Tuesday, February 18 at 3 pm.  Doris Cheadle, Counseling Intern [phone]

## 2018-08-28 NOTE — Progress Notes (Signed)
[facility] Counseling Session  The pt presented to session with a calm mood and matching affect. The pt was alert and oriented throughout session.  The pt expressed that she feels a need to "reconnect to self-worth" following the loss of her mom. The pt described feeling a "void" that she has tried to fill by keeping busy and noticing things that give her a sense of connection to her mom. The pt shared that when she encounters reminders of her mom in her daily life, she feels her mom's spirit and gratitude. The client became tearful when she expressed that she feels guilty at the same time for being able to return to her work and normal activities following cancer treatment. The pt expressed feeling pressure to have connected with a greater sense of purpose following cancer treatment.  The counselor validated the weight of expectation that the pt is experiencing. The counselor and pt explored the source of this expectation and planned to process meaning making following cancer treatment in future sessions.  The pt and counselor will meet again on Tuesday, February 25 at 3 pm.  Doris Cheadle, Counseling Intern [phone]

## 2018-09-04 NOTE — Progress Notes (Signed)
[facility] Counseling Session  The pt presented to session with a calm mood and matching affect. The pt was alert and oriented throughout session.  The pt expressed that she is trying to "balance" emotions of peace and guilt around getting back to her regular lifestyle after completing treatment and losing her mom. The pt shared that she feels like she needs to "add something" to her life to reconnect with her own sense of purpose and meaning making. The pt expressed that she is "moving forward differently" and still exploring what she would like her current life to look like.  The pt and counselor processed the pt's experience of grief and loss. The counselor provided empathetic reflection, listening, and validation of the pt's experience.  The pt and counselor will meet again on Friday, March 13 at 10 am.  Doris Cheadle, Counseling Intern [phone]

## 2018-09-25 NOTE — Progress Notes (Signed)
[facility] Counseling Session   The pt presented to session with a calm mood and matching affect. The pt was alert and oriented throughout session.   The pt shared that she has been having to "power through" over the last couple weeks, which she related to responsibilities at work. The pt expressed that she has been able to take the last couple weekends to relax at home and give herself space to be with any emotions that come up around the loss of her mom. The pt shared that she has recently had conversations with her husband about "moving forward differently" in their family. She reported that she recognizes change in family traditions and roles is occurring, and is working to "take this day by day."   The pt and counselor processed the pt's emotions of both peace and guilt related to "moving forward differently" in her family. The pt and counselor also discussed the role of self-compassion in experiences of grief and loss.    The pt and counselor will meet again on Tuesday, March 17 at 2 pm. This note was entered late due to an Epic error, which has now been resolved.  Doris Cheadle, Counseling Intern  [phone]

## 2018-09-26 NOTE — Progress Notes (Signed)
[employer] Counseling Session  The pt presented to session with a calm mood and matching affect. The pt was alert and oriented throughout session.  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 The pt also identified that she is finding peace through engaging in activities such as chores, walks outside, working out in her home gym, connecting with friends and family over the phone, spending time with her husband, and talking with her mom as a way to maintain connection after loss.  The pt and counselor explored the pt's coping with social distancing and discussed the pt's self-care plan. The pt and counselor also discussed conducting future sessions over the phone as a precaution related to coronavirus. The pt reported that she is open to phone sessions, and the pt and counselor will have a phone session for follow-up on Tuesday, March 24 at 12 pm.  Doris Cheadle, Counseling Intern [phone]

## 2018-10-02 NOTE — Progress Notes (Signed)
[name] Counseling Session  The pt and counselor conducted session over the phone due to coronavirus precautions. The pt presented with a calm mood and agreeable voice with organized speech. The pt was forthcoming and engaged throughout session.   The pt expressed that she has been coping with changes related to coronavirus by maintaining a similar routine throughout the day. She reported that she has been getting dressed and ready for the day, then working in a space separate from her bedroom each week day. She also shared that focusing on what is in her control has been helpful, including advocating for seniors in the community to have their needs met and taking precautions of staying home and washing hands. When processing the end of the counseling relationship coming up in the next month, the pt expressed gratitude and reported that "talking to someone has helped." She identified areas of growth through counseling as "realizing there is a light at the end of the tunnel" and practicing asking for help, which she shared can be challenging as a helper.  The pt and counselor discussed the pt's current coping and self-care plan. The pt and counselor also processed feelings toward termination and reflected on areas of growth and insight.  The pt and counselor plan to speak again on Tuesday, March 31 at 12 pm. The pt was mailed referral information for counselors in the community for future counseling work following the [employer], per the pt's request that this information be sent to her home address.  Doris Cheadle, Counseling Intern [phone]

## 2018-10-03 NOTE — Progress Notes (Signed)
Rose [name] pt to discuss termination. Pt confirmed that she feels comfortable terminating counseling services at this time. She reported that she will look into counseling referrals provided by the counselor via mail and reach out to the Patient and [facility] if any additional needs arise.  Rose [name], Counseling Intern [phone]

## 2018-12-19 ENCOUNTER — Other Ambulatory Visit: Payer: Self-pay | Admitting: Adult Health

## 2018-12-19 DIAGNOSIS — Z853 Personal history of malignant neoplasm of breast: Secondary | ICD-10-CM

## 2019-01-28 ENCOUNTER — Ambulatory Visit
Admission: RE | Admit: 2019-01-28 | Discharge: 2019-01-28 | Disposition: A | Discharge status: Home / Self Care | Payer: Managed Care, Other (non HMO) | Source: Ambulatory Visit | Attending: Adult Health | Admitting: Adult Health

## 2019-01-28 ENCOUNTER — Other Ambulatory Visit: Payer: Self-pay | Admitting: Adult Health

## 2019-01-28 ENCOUNTER — Other Ambulatory Visit: Payer: Self-pay

## 2019-01-28 DIAGNOSIS — N644 Mastodynia: Secondary | ICD-10-CM

## 2019-01-28 DIAGNOSIS — Z853 Personal history of malignant neoplasm of breast: Secondary | ICD-10-CM

## 2019-01-28 HISTORY — DX: Personal history of irradiation: Z92.3

## 2019-03-06 ENCOUNTER — Other Ambulatory Visit: Payer: Self-pay | Admitting: Obstetrics & Gynecology

## 2019-03-07 NOTE — Telephone Encounter (Signed)
Medication refill request: prenatal vitamin  Last AEX:  12-22-17 SM Next AEX: 03-22-2019 Last MMG (if hormonal medication request): n/a Refill authorized: Today, please advise.   Medication pended for #30, 0RF. Please refill if appropriate.

## 2019-03-20 ENCOUNTER — Other Ambulatory Visit: Payer: Self-pay

## 2019-03-20 NOTE — Progress Notes (Addendum)
48 y.o. G0P0 Married Rose [name] or Rose [name] female here for annual exam.  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  This has now been reclassified as likely uncertain.    Cycles are less regular.    Patient's last menstrual period was 03/15/2019 (approximate).          Sexually active: Yes.    The current method of family planning is none.    Exercising: No.  Home exercise routine includes cardio, weightlifting, and core training.. Smoker:  no  Health Maintenance: Pap: 09/13/16 Negative HPV Negative              05/19/15 Negative  History of abnormal Pap:  Yes MMG:  01/28/19 BIRADS 2 Benign Density C Colonoscopy:  Never  BMD:   2009 normal TDaP:  09/2016 Pneumonia vaccine(s):  n/a Shingrix:   n/a Hep C testing: n/a  Screening Labs: n/a   reports that she has never smoked. She has never used smokeless tobacco. She reports current alcohol use of about 5.0 - 6.0 standard drinks of alcohol per week. She reports that she does not use drugs.  Past Medical History:  Diagnosis Date  . Abdominal pain   . Breast cancer ([place]) 05/17/2017   Right breast  . Chronic rheumatic arthritis ([place])   . Family history of breast cancer   . Family history of colon cancer   . Family history of pancreatic cancer   . Malignant neoplasm of right breast in female, estrogen receptor positive ([place]) 05/25/2017  . N&V (nausea and vomiting)   . Personal history of radiation therapy   . PONV (postoperative nausea and vomiting)   . Poor appetite     Past Surgical History:  Procedure Laterality Date  . APPENDECTOMY  4/12  . BREAST BIOPSY Right 03/2017  . BREAST LUMPECTOMY Right 06/2017  . BREAST LUMPECTOMY WITH RADIOACTIVE SEED LOCALIZATION Right 06/27/2017   Procedure: RIGHT BREAST LUMPECTOMY WITH RADIOACTIVE SEED LOCALIZATION;  Surgeon: Erroll Luna, MD;  Location: [facility];  Service: General;  Laterality: Right;  . COLPOSCOPY  1999  .  HYSTEROSCOPY WITH RESECTOSCOPE  11/10  . MEDIAL COLLATERAL LIGAMENT REPAIR, KNEE Left 05/04/2017   Procedure: LEFT THUMB  COLLATERAL LIGAMENT REPAIR;  Surgeon: Leanora Cover, MD;  Location: [facility];  Service: Orthopedics;  Laterality: Left;    Current Outpatient Medications  Medication Sig Dispense Refill  . Abatacept 125 MG/ML SOAJ Inject into the skin every 30 (thirty) days.    Marland Kitchen acetaminophen (TYLENOL) 500 MG tablet Take 650 mg by mouth every 6 (six) hours as needed.     Marland Kitchen alcohol-propylene glycol (AQUAMED) LOTN Apply topically as needed.    . Dapsone (ACZONE) 5 % topical gel Apply topically daily.     . fish oil-omega-3 fatty acids 1000 MG capsule Take 1,000 mg by mouth 2 (two) times daily.      . fluticasone (FLONASE) 50 MCG/ACT nasal spray USE 2 SPRAYS IN EACH NOSTRIL ONCE DAILY AS NEEDED 30 DAYS  6  . HYDROcodone-acetaminophen (NORCO) 5-325 MG tablet 1-2 tabs po q6 hours prn pain 20 tablet 0  . hydroxychloroquine (PLAQUENIL) 200 MG tablet Take by mouth 2 (two) times daily.      . methocarbamol (ROBAXIN) 500 MG tablet Take 1 tablet (500 mg total) by mouth at bedtime as needed for muscle spasms. 12 tablet 0  . Multiple Vitamin (ONCE DAILY PO) Take by mouth 1 dose over 24 hours.      Marland Kitchen  Prenatal Vit-Fe Fumarate-FA (PRENATAL VITAMIN PLUS LOW IRON) 27-1 MG TABS TAKE 1 TABLET EVERY DAY 30 tablet 11  . valACYclovir (VALTREX) 1000 MG tablet Take 0.5 tablets (500 mg total) by mouth as needed. 30 tablet 2   No current facility-administered medications for this visit.     Family History  Problem Relation Age of Onset  . Breast cancer Maternal Aunt        70's  . Pancreatic cancer Maternal Aunt 63  . Multiple myeloma Mother   . Colon cancer Other        PGF's sister    Review of Systems  Constitutional: Negative.   HENT: Negative.   Eyes: Negative.   Respiratory: Negative.   Cardiovascular: Negative.   Gastrointestinal: Negative.   Endocrine: Negative.    Genitourinary: Negative.   Musculoskeletal: Negative.   Skin: Negative.   Allergic/Immunologic: Negative.   Neurological: Negative.   Hematological: Negative.   Psychiatric/Behavioral: Negative.     Exam:   BP 122/80 (BP Location: Left Arm, Patient Position: Sitting, Cuff Size: Normal)   Pulse 84   Temp 98.1 F (36.7 C) (Temporal)   Ht 5' 0.5" (1.537 m)   Wt 137 lb 3.2 oz (62.2 kg)   LMP 03/15/2019 (Approximate)   BMI 26.35 kg/m   Height:   Height: 5' 0.5" (153.7 cm)  Ht Readings from Last 3 Encounters:  03/22/19 5' 0.5" (1.537 m)  06/29/18 5' 1"  (1.549 m)  01/05/18 5' 1"  (1.549 m)    General appearance: alert, cooperative and appears stated age Head: Normocephalic, without obvious abnormality, atraumatic Neck: no adenopathy, supple, symmetrical, trachea midline and thyroid normal to inspection and palpation Lungs: clear to auscultation bilaterally Breasts: no masses, LAD, nipple discharge; right breast with well healed scars and thickness along scar (stable) Heart: regular rate and rhythm Abdomen: soft, non-tender; bowel sounds normal; no masses,  no organomegaly Extremities: extremities normal, atraumatic, no cyanosis or edema Skin: Skin color, texture, turgor normal. No rashes or lesions Lymph nodes: Cervical, supraclavicular, and axillary nodes normal. No abnormal inguinal nodes palpated Neurologic: Grossly normal   Pelvic: External genitalia:  no lesions              Urethra:  normal appearing urethra with no masses, tenderness or lesions              Bartholins and Skenes: normal                 Vagina: normal appearing vagina with normal color and discharge, no lesions              Cervix: no lesions              Pap taken: No. Bimanual Exam:  Uterus:  enlarged, 10 weeks size, globular in size, mobile weeks size              Adnexa: normal adnexa and no mass, fullness, tenderness               Rectovaginal: Confirms               Anus:  normal sphincter tone,  no lesions  Chaperone was present for exam.  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  Doing yearly 3D. pap smear with neg HR HPV 2018 IFOB given RF for Vicodin 5/325  1-2 tab po prn.  #20/0RF.  Last rx was in 2018. Blood work done with Dr. Amil Amen and Dr. Beaulah Corin obtained today return annually or prn

## 2019-03-22 ENCOUNTER — Other Ambulatory Visit: Payer: Self-pay

## 2019-03-22 ENCOUNTER — Ambulatory Visit: Payer: Managed Care, Other (non HMO) | Admitting: Obstetrics & Gynecology

## 2019-03-22 ENCOUNTER — Encounter

## 2019-03-22 ENCOUNTER — Encounter: Payer: Self-pay | Admitting: Obstetrics & Gynecology

## 2019-03-22 VITALS — BP 122/80 | HR 84 | Temp 98.1°F | Ht 60.5 in | Wt 137.2 lb

## 2019-03-22 DIAGNOSIS — Z1211 Encounter for screening for malignant neoplasm of colon: Secondary | ICD-10-CM | POA: Diagnosis not present

## 2019-03-22 DIAGNOSIS — N898 Other specified noninflammatory disorders of vagina: Secondary | ICD-10-CM | POA: Diagnosis not present

## 2019-03-22 DIAGNOSIS — Z01419 Encounter for gynecological examination (general) (routine) without abnormal findings: Secondary | ICD-10-CM

## 2019-03-22 MED ORDER — VALACYCLOVIR HCL 1 G PO TABS: 500.0000 mg | ORAL_TABLET | ORAL | 2 refills | Status: DC | PRN

## 2019-03-22 MED ORDER — TAMOXIFEN CITRATE 10 MG PO TABS: 10.0000 mg | ORAL_TABLET | Freq: Two times a day (BID) | ORAL | Status: DC

## 2019-03-22 MED ORDER — HYDROCODONE-ACETAMINOPHEN 5-325 MG PO TABS: ORAL_TABLET | ORAL | 0 refills | Status: DC

## 2019-03-22 NOTE — Addendum Note (Signed)
Addended by: Megan Salon on: 03/22/2019 10:52 AM   Modules accepted: Orders

## 2019-03-23 LAB — VAGINITIS/VAGINOSIS, DNA PROBE
Candida Species: NEGATIVE
Gardnerella vaginalis: NEGATIVE
Trichomonas vaginosis: NEGATIVE

## 2019-04-30 LAB — FECAL OCCULT BLOOD, IMMUNOCHEMICAL: Fecal Occult Bld: NEGATIVE

## 2019-06-30 NOTE — Progress Notes (Signed)
Patient Care Team: Merrilee Seashore, MD as PCP - General (Internal Medicine) Nicholas Lose, MD as Consulting Physician (Hematology and Oncology) Erroll Luna, MD as Consulting Physician (General Surgery) Kyung Rudd, MD as Consulting Physician (Radiation Oncology)  DIAGNOSIS:    ICD-10-CM   1. Ductal carcinoma in situ (DCIS) of right breast  D05.11     SUMMARY OF ONCOLOGIC HISTORY: Oncology History  Ductal carcinoma in situ (DCIS) of right breast  04/20/2017 Breast MRI   Right breast middle to posterior depth 1.3 x 0.8 x 0.9 cm suspicious mass spiculated mass, several 4-5 mm enhancing foci, multiple bilateral enhancing foci that are less than 5 mm   05/08/2017 Initial Diagnosis   Right breast biopsy 830 position: DCIS with calcifications, low-grade, ER 95%, PR 90%, Tis N0 stage 0; right breast 10:00:PASH   05/25/2017 -  Anti-estrogen oral therapy   Tamoxifen 20 mg daily   06/19/2017 Genetic Testing   BRIP1 c.3103C>T (p.Arg1035Cys), MSH6 c.2667G>T (p.Gln889His) and NBN c.1684G>A (p.Val562Ile) VUSs found on the comprehensive Common Cancer panel.  The Comprehensive Common Cancer Panel offered by [facility] includes sequencing and/or deletion duplication testing of the following 46 genes: APC, ATM, AXIN2, BAP1, BARD1, BMPR1A, BRCA1, BRCA2, BRIP1, CDH1, CDK4, CDKN2A, CHEK2, EPCAM, FANCC, FH, FLCN, HOXB13, MET, MITF,  MLH1, MSH2, MSH6, MUTYH, NBN, NF1, NTHL1,  PALB2, PMS2, POLD1, POLE, POT1, PTEN, RAD51C, RAD51D, RECQL, SCG5/GREM1, SDHB, SDHC, SDHD, SMAD4, STK11, TP53, TSC1, TSC2, and VHL.   The report date is June 19, 2017. Update: BRIP1 c.3103C>T VUS has been reclassified from a variant of uncertain significance to a likely benign variant based on a combination of sources, e.g., internal data, published literature, population databases and in [place].  The report date is January 19, 2018.    06/27/2017 Surgery   Rt Lumpectomy: DCIS grade 1 spanning 0.3 cm, margins Neg, Intra  ductal papilloma, UDH, CSL, Tis Nx Stage 0   08/14/2017 - 09/26/2017 Radiation Therapy   Adjuvant radiation therapy   10/28/2017 -  Anti-estrogen oral therapy   Tamoxifen 10 mg daily     CHIEF COMPLIANT: Follow-up of right breast DCIS on tamoxifen therapy  INTERVAL HISTORY: Rose [name] is a 48 y.o. with above-mentioned history of right breast DCIS treated with lumpectomy, radiation, and who is currently on tamoxifen 10 mg daily. Mammogram on 01/28/19 showed no evidence of malignancy bilaterally. She presents to the clinic today for annual follow-up.  She continues to have breakouts with dermatitis on her right breast.  These are happening less often than before.  She reports mild vaginal discharge from the effect of tamoxifen.  Continues to have hot flashes and hair thinning.  REVIEW OF SYSTEMS:   Constitutional: Denies fevers, chills or abnormal weight loss Eyes: Denies blurriness of vision Ears, nose, mouth, throat, and face: Denies mucositis or sore throat Respiratory: Denies cough, dyspnea or wheezes Cardiovascular: Denies palpitation, chest discomfort Gastrointestinal: Denies nausea, heartburn or change in bowel habits Skin: Denies abnormal skin rashes Lymphatics: Denies new lymphadenopathy or easy bruising Neurological: Denies numbness, tingling or new weaknesses Behavioral/Psych: Mood is stable, no new changes  Extremities: No lower extremity edema Breast: denies any pain or lumps or nodules in either breasts All other systems were reviewed with the patient and are negative.  I have reviewed the past medical history, past surgical history, social history and family history with the patient and they are unchanged from previous note.  ALLERGIES:  has No Known Allergies.  MEDICATIONS:  Current Outpatient Medications  Medication  Sig Dispense Refill  . Abatacept 125 MG/ML SOAJ Inject into the skin every 30 (thirty) days.    Marland Kitchen acetaminophen (TYLENOL) 500 MG tablet Take 650  mg by mouth every 6 (six) hours as needed.     Marland Kitchen alcohol-propylene glycol (AQUAMED) LOTN Apply topically as needed.    . Dapsone (ACZONE) 5 % topical gel Apply topically daily.     . fish oil-omega-3 fatty acids 1000 MG capsule Take 1,000 mg by mouth 2 (two) times daily.      . fluticasone (FLONASE) 50 MCG/ACT nasal spray USE 2 SPRAYS IN EACH NOSTRIL ONCE DAILY AS NEEDED 30 DAYS  6  . HYDROcodone-acetaminophen (NORCO) 5-325 MG tablet 1-2 tabs po q6 hours prn pain 20 tablet 0  . hydroxychloroquine (PLAQUENIL) 200 MG tablet Take by mouth 2 (two) times daily.      . methocarbamol (ROBAXIN) 500 MG tablet Take 1 tablet (500 mg total) by mouth at bedtime as needed for muscle spasms. 12 tablet 0  . Multiple Vitamin (ONCE DAILY PO) Take by mouth 1 dose over 24 hours.      . Prenatal Vit-Fe Fumarate-FA (PRENATAL VITAMIN PLUS LOW IRON) 27-1 MG TABS TAKE 1 TABLET EVERY DAY 30 tablet 11  . tamoxifen (NOLVADEX) 10 MG tablet Take 1 tablet (10 mg total) by mouth 2 (two) times daily.    . valACYclovir (VALTREX) 1000 MG tablet Take 0.5 tablets (500 mg total) by mouth as needed. 30 tablet 2   No current facility-administered medications for this visit.    PHYSICAL EXAMINATION: ECOG PERFORMANCE STATUS: 1 - Symptomatic but completely ambulatory  Vitals:   07/01/19 0857  BP: (!) 142/82  Pulse: 99  Resp: 20  Temp: 98.2 F (36.8 C)  SpO2: 100%   Filed Weights   07/01/19 0857  Weight: 135 lb (61.2 kg)    GENERAL: alert, no distress and comfortable SKIN: skin color, texture, turgor are normal, no rashes or significant lesions EYES: normal, Conjunctiva are pink and non-injected, sclera clear OROPHARYNX: no exudate, no erythema and lips, buccal mucosa, and tongue normal  NECK: supple, thyroid normal size, non-tender, without nodularity LYMPH: no palpable lymphadenopathy in the cervical, axillary or inguinal LUNGS: clear to auscultation and percussion with normal breathing effort HEART: regular rate &  rhythm and no murmurs and no lower extremity edema ABDOMEN: abdomen soft, non-tender and normal bowel sounds MUSCULOSKELETAL: no cyanosis of digits and no clubbing  NEURO: alert & oriented x 3 with fluent speech, no focal motor/sensory deficits EXTREMITIES: No lower extremity edema BREAST: No palpable masses or nodules in either right or left breasts. No palpable axillary supraclavicular or infraclavicular adenopathy no breast tenderness or nipple discharge. (exam performed in the presence of a chaperone)  LABORATORY DATA:  I have reviewed the data as listed CMP Latest Ref Rng & Units 06/27/2017 11/04/2010  Glucose 65 - 99 mg/dL 93 90  BUN 6 - 20 mg/dL 9 7  Creatinine 0.44 - 1.00 mg/dL 0.83 0.78  Sodium 135 - 145 mmol/L 140 132(L)  Potassium 3.5 - 5.1 mmol/L 3.4(L) 3.7  Chloride 101 - 111 mmol/L 107 99  CO2 22 - 32 mmol/L 24 23  Calcium 8.9 - 10.3 mg/dL 9.2 9.1  Total Protein 6.5 - 8.1 g/dL 7.0 -  Total Bilirubin 0.3 - 1.2 mg/dL 1.0 -  Alkaline Phos 38 - 126 U/L 29(L) -  AST 15 - 41 U/L 26 -  ALT 14 - 54 U/L 15 -    Lab Results  Component  Value Date   WBC 8.4 06/27/2017   HGB 13.4 06/27/2017   HCT 40.8 06/27/2017   MCV 89.5 06/27/2017   PLT 207 06/27/2017   NEUTROABS 2.9 06/27/2017    ASSESSMENT & PLAN:  Ductal carcinoma in situ (DCIS) of right breast 06/27/2018:Rt Lumpectomy: DCIS grade 1 spanning 0.3 cm, margins Neg, Intra ductal papilloma, UDH, CSL, Tis Nx Stage 0 Adjuvant radiation therapy2/10/2017-09/26/2017  Treatment plan: Adjuvant antiestrogen therapy with tamoxifen 20 mg daily times 5 years started 09/26/17  Tamoxifen Toxicities: 1.  Hot flashes 2.  Hair thinning 3.  Mood swings  Patient also experiencing leg cramps Rash on the right breast which comes on with stress.  She is using cortisone cream which appears to help.  These episodes are less often than previously.  Breast Cancer Surveillance: 1. Breast Exam: 07/01/2019: Benign 2. Mammogram: 01/28/2019:  Benign breast density category C  RTC in 1 year for follow up    No orders of the defined types were placed in this encounter.  The patient has a good understanding of the overall plan. she agrees with it. she will call with any problems that may develop before the next visit here.  Nicholas Lose, MD 07/01/2019  Julious Oka Dorshimer, am acting as scribe for Dr. Nicholas Lose.  I have reviewed the above document for accuracy and completeness, and I agree with the above.

## 2019-07-01 ENCOUNTER — Other Ambulatory Visit: Payer: Self-pay

## 2019-07-01 ENCOUNTER — Inpatient Hospital Stay: Payer: Managed Care, Other (non HMO) | Attending: Hematology and Oncology | Admitting: Hematology and Oncology

## 2019-07-01 ENCOUNTER — Telehealth: Payer: Self-pay | Admitting: Hematology and Oncology

## 2019-07-01 DIAGNOSIS — Z79810 Long term (current) use of selective estrogen receptor modulators (SERMs): Secondary | ICD-10-CM | POA: Diagnosis not present

## 2019-07-01 DIAGNOSIS — Z923 Personal history of irradiation: Secondary | ICD-10-CM | POA: Diagnosis not present

## 2019-07-01 DIAGNOSIS — Z170 Estrogen receptor positive status [ER+]: Secondary | ICD-10-CM | POA: Diagnosis not present

## 2019-07-01 DIAGNOSIS — D0511 Intraductal carcinoma in situ of right breast: Secondary | ICD-10-CM | POA: Diagnosis present

## 2019-07-01 DIAGNOSIS — R232 Flushing: Secondary | ICD-10-CM | POA: Diagnosis not present

## 2019-07-01 MED ORDER — TAMOXIFEN CITRATE 10 MG PO TABS: 10.0000 mg | ORAL_TABLET | Freq: Every day | ORAL | 3 refills | Status: DC

## 2019-07-01 NOTE — Assessment & Plan Note (Addendum)
06/27/2018:Rt Lumpectomy: DCIS grade 1 spanning 0.3 cm, margins Neg, Intra ductal papilloma, UDH, CSL, Tis Nx Stage 0 Adjuvant radiation therapy2/10/2017-09/26/2017  Treatment plan: Adjuvant antiestrogen therapy with tamoxifen 20 mg daily times 5 years started 09/26/17  Tamoxifen Toxicities: 1.  Hot flashes 2.  Hair thinning  Patient also experiencing leg cramps Rash on the right breast which comes on with stress.  She is using cortisone cream which appears to help.  Breast Cancer Surveillance: 1. Breast Exam: 07/01/2019: Benign 2. Mammogram: 01/28/2019: Benign breast density category C  RTC in 1 year for follow up

## 2019-07-01 NOTE — Telephone Encounter (Signed)
I talk with patient regarding schedule  

## 2019-09-04 ENCOUNTER — Encounter: Payer: Self-pay | Admitting: Genetic Counselor

## 2019-09-27 ENCOUNTER — Ambulatory Visit: Payer: Managed Care, Other (non HMO) | Attending: Internal Medicine

## 2019-09-27 DIAGNOSIS — Z23 Encounter for immunization: Secondary | ICD-10-CM

## 2019-09-27 NOTE — Progress Notes (Signed)
   Covid-19 Vaccination Clinic  Name:  Rose [name]    MRN: [id number] DOB: 03-26-1971  09/27/2019  [name] was observed post Covid-19 immunization for 15 minutes without incident. She was provided with Vaccine Information Sheet and instruction to access the V-Safe system.   [name] was instructed to call 911 with any severe reactions post vaccine: Marland Kitchen Difficulty breathing  . Swelling of face and throat  . A fast heartbeat  . A bad rash all over body  . Dizziness and weakness   Immunizations Administered    Name Date Dose VIS Date Route   Pfizer COVID-19 Vaccine 09/27/2019  1:43 PM 0.3 mL 06/21/2019 Intramuscular   Manufacturer: [place]   Lot: [id number]   [place]: [id number]

## 2019-10-22 ENCOUNTER — Ambulatory Visit: Payer: Managed Care, Other (non HMO) | Attending: Internal Medicine

## 2019-10-22 DIAGNOSIS — Z23 Encounter for immunization: Secondary | ICD-10-CM

## 2019-10-22 NOTE — Progress Notes (Signed)
   Covid-19 Vaccination Clinic  Name:  WINNIE CISNEY    MRN: [id number] DOB: 1971-03-10  10/22/2019  [name] was observed post Covid-19 immunization for 15 minutes without incident. She was provided with Vaccine Information Sheet and instruction to access the V-Safe system.   [name] was instructed to call 911 with any severe reactions post vaccine: Marland Kitchen Difficulty breathing  . Swelling of face and throat  . A fast heartbeat  . A bad rash all over body  . Dizziness and weakness   Immunizations Administered    Name Date Dose VIS Date Route   Pfizer COVID-19 Vaccine 10/22/2019  2:39 PM 0.3 mL 06/21/2019 Intramuscular   Manufacturer: [place]   Lot: [id number]   [place]: [id number]

## 2020-01-16 ENCOUNTER — Other Ambulatory Visit: Payer: Self-pay | Admitting: Obstetrics & Gynecology

## 2020-01-16 DIAGNOSIS — Z1231 Encounter for screening mammogram for malignant neoplasm of breast: Secondary | ICD-10-CM

## 2020-01-16 DIAGNOSIS — Z853 Personal history of malignant neoplasm of breast: Secondary | ICD-10-CM

## 2020-02-05 ENCOUNTER — Other Ambulatory Visit: Payer: Self-pay | Admitting: Obstetrics & Gynecology

## 2020-02-05 DIAGNOSIS — Z853 Personal history of malignant neoplasm of breast: Secondary | ICD-10-CM

## 2020-02-07 ENCOUNTER — Ambulatory Visit
Admission: RE | Admit: 2020-02-07 | Discharge: 2020-02-07 | Disposition: A | Discharge status: Home / Self Care | Payer: Managed Care, Other (non HMO) | Source: Ambulatory Visit | Attending: Obstetrics & Gynecology | Admitting: Obstetrics & Gynecology

## 2020-02-07 ENCOUNTER — Other Ambulatory Visit: Payer: Self-pay

## 2020-02-07 DIAGNOSIS — Z853 Personal history of malignant neoplasm of breast: Secondary | ICD-10-CM

## 2020-03-19 ENCOUNTER — Other Ambulatory Visit: Payer: Self-pay | Admitting: Obstetrics & Gynecology

## 2020-03-19 DIAGNOSIS — Z01419 Encounter for gynecological examination (general) (routine) without abnormal findings: Secondary | ICD-10-CM

## 2020-03-19 NOTE — Telephone Encounter (Signed)
Medication refill request: Prenatal Vitamins  Last AEX:  03/22/19 Next AEX: 04/13/20 Last MMG (if hormonal medication request): NA Refill authorized: 30/0

## 2020-04-09 NOTE — Progress Notes (Signed)
49 y.o. G0P0 Married Rose [name] American female here for annual exam.  Doing well.  She is seeing her dad and sister this month.  They are seeing them more but they've had some months where they couldn't see each other.  Cycles are definitely shorter.  This cycle has been abut four days and she is now having very little.  Cramping is still present.  Flow is less.  Takes anti-inflammatories every 4 hours when on cycle.  Very rarely uses hydrocodone.  Received #20 tablets yearly.  This helps with cramping.  Cycles are still fairly regular.    Is scheduled for her Ashley booster on 10/18.    Has appt with Dr. Ashby Dawes in November.  Has blood work every 3-4 months with Dr. Amil Amen.    Patient's last menstrual period was 04/10/2020 (exact date).          Sexually active: No.  The current method of family planning is abstinence.    Exercising: Yes.    walking, weights & cardio Smoker:  no  Health Maintenance: Pap:  09-13-16 neg HPV HR neg, 12-12-17 neg History of abnormal Pap:  Per patient unaware of any abnormality MMG:  02-07-2020 category d density birads 2:neg Colonoscopy: none BMD:   2009 normal TDaP:  2018 Screening Labs: none indicated today   reports that she has never smoked. She has never used smokeless tobacco. She reports current alcohol use of about 5.0 - 6.0 standard drinks of alcohol per week. She reports that she does not use drugs.  Past Medical History:  Diagnosis Date  . Abdominal pain   . Breast cancer ([place]) 05/17/2017   Right breast  . Chronic rheumatic arthritis ([place])   . Family history of breast cancer   . Family history of colon cancer   . Family history of pancreatic cancer   . Malignant neoplasm of right breast in female, estrogen receptor positive ([place]) 05/25/2017  . N&V (nausea and vomiting)   . Personal history of radiation therapy   . PONV (postoperative nausea and vomiting)   . Poor appetite     Past Surgical History:  Procedure Laterality Date   . APPENDECTOMY  4/12  . BREAST BIOPSY Right 03/2017  . BREAST LUMPECTOMY Right 06/2017  . BREAST LUMPECTOMY WITH RADIOACTIVE SEED LOCALIZATION Right 06/27/2017   Procedure: RIGHT BREAST LUMPECTOMY WITH RADIOACTIVE SEED LOCALIZATION;  Surgeon: Erroll Luna, MD;  Location: [facility];  Service: General;  Laterality: Right;  . COLPOSCOPY  1999  . HYSTEROSCOPY WITH RESECTOSCOPE  11/10  . MEDIAL COLLATERAL LIGAMENT REPAIR, KNEE Left 05/04/2017   Procedure: LEFT THUMB  COLLATERAL LIGAMENT REPAIR;  Surgeon: Leanora Cover, MD;  Location: [facility];  Service: Orthopedics;  Laterality: Left;    Current Outpatient Medications  Medication Sig Dispense Refill  . Abatacept 125 MG/ML SOAJ Inject into the skin every 30 (thirty) days.    Marland Kitchen acetaminophen (TYLENOL) 500 MG tablet Take 650 mg by mouth every 6 (six) hours as needed.     . Dapsone (ACZONE) 5 % topical gel Apply topically daily.     . fish oil-omega-3 fatty acids 1000 MG capsule Take 1,000 mg by mouth 2 (two) times daily.      . fluticasone (FLONASE) 50 MCG/ACT nasal spray USE 2 SPRAYS IN EACH NOSTRIL ONCE DAILY AS NEEDED 30 DAYS  6  . HYDROcodone-acetaminophen (NORCO) 5-325 MG tablet 1-2 tabs po q6 hours prn pain 20 tablet 0  . hydroxychloroquine (PLAQUENIL) 200 MG  tablet Take by mouth 2 (two) times daily.      . methocarbamol (ROBAXIN) 500 MG tablet Take 1 tablet (500 mg total) by mouth at bedtime as needed for muscle spasms. 12 tablet 0  . Multiple Vitamin (ONCE DAILY PO) Take by mouth 1 dose over 24 hours.      . Prenatal 27-1 MG TABS TAKE 1 TABLET EVERY DAY 30 tablet 0  . tamoxifen (NOLVADEX) 10 MG tablet Take 1 tablet (10 mg total) by mouth daily. 90 tablet 3  . TRIAMCINOLONE ACETONIDE EX Apply topically. 0.17% cream    . UNABLE TO FIND Clindamycin phosphate & tretinoin gel 1.2%    . UNABLE TO FIND Prenatal plus    . valACYclovir (VALTREX) 1000 MG tablet Take 0.5 tablets (500 mg total) by mouth as needed.  30 tablet 2   No current facility-administered medications for this visit.    Family History  Problem Relation Age of Onset  . Breast cancer Maternal Aunt        70's  . Pancreatic cancer Maternal Aunt 63  . Multiple myeloma Mother   . Colon cancer Other        PGF's sister    Review of Systems  Constitutional: Negative.   HENT: Negative.   Eyes: Negative.   Respiratory: Negative.   Cardiovascular: Negative.   Gastrointestinal: Negative.   Endocrine: Negative.   Genitourinary: Negative.   Musculoskeletal: Negative.   Skin: Negative.   Allergic/Immunologic: Negative.   Neurological: Negative.   Hematological: Negative.   Psychiatric/Behavioral: Negative.     Exam:   Ht 5' 0.75" (1.543 m)   Wt 140 lb (63.5 kg)   LMP 04/10/2020 (Exact Date)   BMI 26.67 kg/m   Height: 5' 0.75" (154.3 cm)  General appearance: alert, cooperative and appears stated age Head: Normocephalic, without obvious abnormality, atraumatic Neck: no adenopathy, supple, symmetrical, trachea midline and thyroid normal to inspection and palpation Lungs: clear to auscultation bilaterally Breasts: normal appearance, no masses or tenderness Heart: regular rate and rhythm Abdomen: soft, non-tender; bowel sounds normal; no masses,  no organomegaly Extremities: extremities normal, atraumatic, no cyanosis or edema Skin: Skin color, texture, turgor normal. No rashes or lesions Lymph nodes: Cervical, supraclavicular, and axillary nodes normal. No abnormal inguinal nodes palpated Neurologic: Grossly normal   Pelvic: External genitalia:  no lesions              Urethra:  normal appearing urethra with no masses, tenderness or lesions              Bartholins and Skenes: normal                 Vagina: normal appearing vagina with normal color and discharge, no lesions              Cervix: no lesions              Pap taken: Yes.   Bimanual Exam:  Uterus:  enlarged, 10 weeks size, globular, mobile and stable in  size              Adnexa: normal adnexa and no mass, fullness, tenderness               Rectovaginal: Confirms               Anus:  normal sphincter tone, no lesions  Chaperone, Royal Hawthorn, CMA, was present for exam.  A:  Well Woman with normal exam H/o uterine fibroids RA DCIS, s/p  lumpectomy and radiation, on Tamoxifen for 5 years H/o HSV 2 Dysmenorrhea Fibroid uterus, stable in size  P:   Mammogram guidelines reviewed.  Doing yearly 3D. pap smear with HR HPV obtained today Colonoscopy referral placed today Lab work done with Dr. Amil Amen and Dr. Ashby Dawes RF for Vicodin 5/325 1-2 tabs po prn uterine cramping #20/0RF.  Never uses more than this in a calendar year. Return annually or prn

## 2020-04-13 ENCOUNTER — Encounter: Payer: Self-pay | Admitting: Obstetrics & Gynecology

## 2020-04-13 ENCOUNTER — Other Ambulatory Visit: Payer: Self-pay

## 2020-04-13 ENCOUNTER — Other Ambulatory Visit (HOSPITAL_COMMUNITY)
Admission: RE | Admit: 2020-04-13 | Discharge: 2020-04-13 | Disposition: A | Discharge status: Home / Self Care | Payer: Managed Care, Other (non HMO) | Source: Ambulatory Visit | Attending: Obstetrics & Gynecology | Admitting: Obstetrics & Gynecology

## 2020-04-13 ENCOUNTER — Other Ambulatory Visit: Payer: Self-pay | Admitting: Obstetrics & Gynecology

## 2020-04-13 ENCOUNTER — Ambulatory Visit (INDEPENDENT_AMBULATORY_CARE_PROVIDER_SITE_OTHER): Payer: Managed Care, Other (non HMO) | Admitting: Obstetrics & Gynecology

## 2020-04-13 VITALS — BP 118/78 | HR 68 | Resp 16 | Ht 60.75 in | Wt 140.0 lb

## 2020-04-13 DIAGNOSIS — Z124 Encounter for screening for malignant neoplasm of cervix: Secondary | ICD-10-CM | POA: Diagnosis not present

## 2020-04-13 DIAGNOSIS — Z1211 Encounter for screening for malignant neoplasm of colon: Secondary | ICD-10-CM

## 2020-04-13 DIAGNOSIS — Z01419 Encounter for gynecological examination (general) (routine) without abnormal findings: Secondary | ICD-10-CM

## 2020-04-13 MED ORDER — HYDROCODONE-ACETAMINOPHEN 5-325 MG PO TABS: ORAL_TABLET | ORAL | 0 refills | Status: AC

## 2020-04-14 LAB — CYTOLOGY - PAP
Comment: NEGATIVE
Diagnosis: NEGATIVE
High risk HPV: NEGATIVE

## 2020-04-25 ENCOUNTER — Other Ambulatory Visit: Payer: Self-pay | Admitting: Obstetrics & Gynecology

## 2020-04-25 DIAGNOSIS — Z01419 Encounter for gynecological examination (general) (routine) without abnormal findings: Secondary | ICD-10-CM

## 2020-04-27 ENCOUNTER — Other Ambulatory Visit (HOSPITAL_BASED_OUTPATIENT_CLINIC_OR_DEPARTMENT_OTHER): Payer: Self-pay | Admitting: Internal Medicine

## 2020-04-27 ENCOUNTER — Ambulatory Visit: Payer: Managed Care, Other (non HMO) | Attending: Internal Medicine

## 2020-04-27 DIAGNOSIS — Z23 Encounter for immunization: Secondary | ICD-10-CM

## 2020-04-27 NOTE — Progress Notes (Signed)
   Covid-19 Vaccination Clinic  Name:  Rose [name]    MRN: [id number] DOB: 06/02/1971  04/27/2020  [name] was observed post Covid-19 immunization for 15 minutes without incident. She was provided with Vaccine Information Sheet and instruction to access the V-Safe system. Vaccinated by Harriet Pho.  [name] was instructed to call 911 with any severe reactions post vaccine: Marland Kitchen Difficulty breathing  . Swelling of face and throat  . A fast heartbeat  . A bad rash all over body  . Dizziness and weakness

## 2020-04-27 NOTE — Telephone Encounter (Signed)
Medication refill request: Prenatal Vitamins  Last AEX:  04/13/20 Next AEX: 06/30/21 Last MMG (if hormonal medication request): 02/07/20  Neg  Refill authorized: 30/0

## 2020-05-05 MED FILL — PFIZER-BIONTECH COVID-19 VA: 30 | 1 days supply | Qty: 0 | Fill #0

## 2020-06-29 NOTE — Assessment & Plan Note (Signed)
06/27/2018:Rt Lumpectomy: DCIS grade 1 spanning 0.3 cm, margins Neg, Intra ductal papilloma, UDH, CSL, Tis Nx Stage 0 Adjuvant radiation therapy2/10/2017-09/26/2017  Treatment plan: Adjuvant antiestrogen therapy with tamoxifen 20 mg daily times 5 yearsstarted 09/26/17  Tamoxifen Toxicities: 1.Hot flashes 2.Hair thinning 3.  Mood swings  Patient also experiencing leg cramps Rash on the right breast which comes on with stress. She is using cortisone cream which appears to help.  These episodes are less often than previously.  Breast Cancer Surveillance: 1. Breast Exam:06/30/20: Benign 2. Mammogram: 02/07/20: Benign breast density category C  RTC in 1 year for follow up

## 2020-06-29 NOTE — Progress Notes (Signed)
Patient Care Team: Merrilee Seashore, MD as PCP - General (Internal Medicine) Nicholas Lose, MD as Consulting Physician (Hematology and Oncology) Erroll Luna, MD as Consulting Physician (General Surgery) Kyung Rudd, MD as Consulting Physician (Radiation Oncology)  DIAGNOSIS:    ICD-10-CM   1. Ductal carcinoma in situ (DCIS) of right breast  D05.11     SUMMARY OF ONCOLOGIC HISTORY: Oncology History  Ductal carcinoma in situ (DCIS) of right breast  04/20/2017 Breast MRI   Right breast middle to posterior depth 1.3 x 0.8 x 0.9 cm suspicious mass spiculated mass, several 4-5 mm enhancing foci, multiple bilateral enhancing foci that are less than 5 mm   05/08/2017 Initial Diagnosis   Right breast biopsy 830 position: DCIS with calcifications, low-grade, ER 95%, PR 90%, Tis N0 stage 0; right breast 10:00:PASH   05/25/2017 -  Anti-estrogen oral therapy   Tamoxifen 20 mg daily   06/19/2017 Genetic Testing   BRIP1 c.3103C>T (p.Arg1035Cys), MSH6 c.2667G>T (p.Gln889His) and NBN c.1684G>A (p.Val562Ile) VUSs found on the comprehensive Common Cancer panel.  The Comprehensive Common Cancer Panel offered by [facility] includes sequencing and/or deletion duplication testing of the following 46 genes: APC, ATM, AXIN2, BAP1, BARD1, BMPR1A, BRCA1, BRCA2, BRIP1, CDH1, CDK4, CDKN2A, CHEK2, EPCAM, FANCC, FH, FLCN, HOXB13, MET, MITF,  MLH1, MSH2, MSH6, MUTYH, NBN, NF1, NTHL1,  PALB2, PMS2, POLD1, POLE, POT1, PTEN, RAD51C, RAD51D, RECQL, SCG5/GREM1, SDHB, SDHC, SDHD, SMAD4, STK11, TP53, TSC1, TSC2, and VHL.   The report date is June 19, 2017. Update: BRIP1 c.3103C>T VUS has been reclassified from a variant of uncertain significance to a likely benign variant based on a combination of sources, e.g., internal data, published literature, population databases and in [place].  The report date is January 19, 2018.  MSH6 c.2667G>T VUS has been reclassified from a variant of uncertain significance to a  likely benign variant based on a combination of sources, e.g., internal data, published literature, population databases and in [place].  The report date is August 26, 2019.    06/27/2017 Surgery   Rt Lumpectomy: DCIS grade 1 spanning 0.3 cm, margins Neg, Intra ductal papilloma, UDH, CSL, Tis Nx Stage 0   08/14/2017 - 09/26/2017 Radiation Therapy   Adjuvant radiation therapy   10/28/2017 -  Anti-estrogen oral therapy   Tamoxifen 10 mg daily     CHIEF COMPLIANT: Follow-up of right breast DCIS on tamoxifen therapy  INTERVAL HISTORY: Rose [name] is a 49 y.o. with above-mentioned history of right breast DCIS treated with lumpectomy, radiation, and who is currently on tamoxifen 10 mg daily. Mammogram on 02/07/20 showed no evidence of malignancy bilaterally. She presents to the clinic today for annual follow-up.   She continues to experience hot flashes, vaginal discharge, irritability, mood swings, muscle cramps as result of tamoxifen therapy.  She is currently on 10 mg daily.  Also continues to experience some mild discomfort in the right breast.    ALLERGIES:  has No Known Allergies.  MEDICATIONS:  Current Outpatient Medications  Medication Sig Dispense Refill  . Abatacept 125 MG/ML SOAJ Inject into the skin every 30 (thirty) days.    Marland Kitchen acetaminophen (TYLENOL) 500 MG tablet Take 650 mg by mouth every 6 (six) hours as needed.     . Dapsone (ACZONE) 5 % topical gel Apply topically daily.     . fish oil-omega-3 fatty acids 1000 MG capsule Take 1,000 mg by mouth 2 (two) times daily.      . fluticasone (FLONASE) 50 MCG/ACT nasal spray USE 2  SPRAYS IN EACH NOSTRIL ONCE DAILY AS NEEDED 30 DAYS  6  . HYDROcodone-acetaminophen (NORCO) 5-325 MG tablet 1-2 tabs po q6 hours prn pain 20 tablet 0  . hydroxychloroquine (PLAQUENIL) 200 MG tablet Take by mouth 2 (two) times daily.      . methocarbamol (ROBAXIN) 500 MG tablet Take 1 tablet (500 mg total) by mouth at bedtime as needed for muscle  spasms. 12 tablet 0  . Multiple Vitamin (ONCE DAILY PO) Take by mouth 1 dose over 24 hours.      . Prenatal 27-1 MG TABS TAKE 1 TABLET EVERY DAY 30 tablet 12  . tamoxifen (NOLVADEX) 10 MG tablet Take 1 tablet (10 mg total) by mouth daily. 90 tablet 3  . TRIAMCINOLONE ACETONIDE EX Apply topically. 0.17% cream    . UNABLE TO FIND Clindamycin phosphate & tretinoin gel 1.2%    . UNABLE TO FIND Prenatal plus    . valACYclovir (VALTREX) 1000 MG tablet Take 0.5 tablets (500 mg total) by mouth as needed. 30 tablet 2   No current facility-administered medications for this visit.    PHYSICAL EXAMINATION: ECOG PERFORMANCE STATUS: 1 - Symptomatic but completely ambulatory  Vitals:   06/30/20 0856  BP: 128/88  Pulse: 94  Resp: 17  Temp: 98.5 F (36.9 C)  SpO2: 100%   Filed Weights   06/30/20 0856  Weight: 137 lb 1.6 oz (62.2 kg)    BREAST: No palpable masses or nodules in either right or left breasts. No palpable axillary supraclavicular or infraclavicular adenopathy no breast tenderness or nipple discharge. (exam performed in the presence of a chaperone)  LABORATORY DATA:  I have reviewed the data as listed CMP Latest Ref Rng & Units 06/27/2017 11/04/2010  Glucose 65 - 99 mg/dL 93 90  BUN 6 - 20 mg/dL 9 7  Creatinine 0.44 - 1.00 mg/dL 0.83 0.78  Sodium 135 - 145 mmol/L 140 132(L)  Potassium 3.5 - 5.1 mmol/L 3.4(L) 3.7  Chloride 101 - 111 mmol/L 107 99  CO2 22 - 32 mmol/L 24 23  Calcium 8.9 - 10.3 mg/dL 9.2 9.1  Total Protein 6.5 - 8.1 g/dL 7.0 -  Total Bilirubin 0.3 - 1.2 mg/dL 1.0 -  Alkaline Phos 38 - 126 U/L 29(L) -  AST 15 - 41 U/L 26 -  ALT 14 - 54 U/L 15 -    Lab Results  Component Value Date   WBC 8.4 06/27/2017   HGB 13.4 06/27/2017   HCT 40.8 06/27/2017   MCV 89.5 06/27/2017   PLT 207 06/27/2017   NEUTROABS 2.9 06/27/2017    ASSESSMENT & PLAN:  Ductal carcinoma in situ (DCIS) of right breast 06/27/2018:Rt Lumpectomy: DCIS grade 1 spanning 0.3 cm, margins Neg,  Intra ductal papilloma, UDH, CSL, Tis Nx Stage 0 Adjuvant radiation therapy2/10/2017-09/26/2017  Treatment plan: Adjuvant antiestrogen therapy with tamoxifen 10 mg daily times 5 yearsstarted 09/26/17  Tamoxifen Toxicities: 1.Hot flashes: Moderate to severe: I instructed her to take tamoxifen at bedtime 2.Hair thinning 3.  Mood swings 4.  Vaginal discharge  Patient also experiencing leg cramps: She takes Robaxin as needed Rash on the right breast which comes on with stress. She is using triamcinolone cream which appears to help.     Breast Cancer Surveillance: 1. Breast Exam:06/30/20: Benign 2. 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  She is very concerned about the low rate of vaccination among the employees at the city  of [place]. RTC in 1 year for follow up    No orders of the defined types were placed in this encounter.  The patient has a good understanding of the overall plan. she agrees with it. she will call with any problems that may develop before the next visit here.  Total time spent: 20 mins including face to face time and time spent for planning, charting and coordination of care  Nicholas Lose, MD 06/30/2020  I, Cloyde Reams Dorshimer, am acting as scribe for Dr. Nicholas Lose.  I have reviewed the above documentation for accuracy and completeness, and I agree with the above.

## 2020-06-30 ENCOUNTER — Other Ambulatory Visit: Payer: Self-pay

## 2020-06-30 ENCOUNTER — Inpatient Hospital Stay: Payer: Managed Care, Other (non HMO) | Attending: Hematology and Oncology | Admitting: Hematology and Oncology

## 2020-06-30 ENCOUNTER — Telehealth: Payer: Self-pay | Admitting: Hematology and Oncology

## 2020-06-30 DIAGNOSIS — Z923 Personal history of irradiation: Secondary | ICD-10-CM | POA: Insufficient documentation

## 2020-06-30 DIAGNOSIS — N898 Other specified noninflammatory disorders of vagina: Secondary | ICD-10-CM | POA: Diagnosis not present

## 2020-06-30 DIAGNOSIS — D0511 Intraductal carcinoma in situ of right breast: Secondary | ICD-10-CM | POA: Diagnosis present

## 2020-06-30 DIAGNOSIS — N951 Menopausal and female climacteric states: Secondary | ICD-10-CM | POA: Insufficient documentation

## 2020-06-30 MED ORDER — TAMOXIFEN CITRATE 10 MG PO TABS: 10.0000 mg | ORAL_TABLET | Freq: Every day | ORAL | 3 refills | Status: DC

## 2020-06-30 MED ORDER — METHOCARBAMOL 500 MG PO TABS
500.0000 mg | ORAL_TABLET | Freq: Three times a day (TID) | ORAL | 0 refills | Status: AC | PRN
Start: 2020-06-30 — End: ?

## 2020-06-30 NOTE — Telephone Encounter (Signed)
Scheduled appts per 12/21 LOS. Gave pt a print out of AVS.

## 2020-07-02 ENCOUNTER — Encounter: Payer: Managed Care, Other (non HMO) | Admitting: Gastroenterology

## 2020-08-20 ENCOUNTER — Other Ambulatory Visit: Payer: Self-pay

## 2020-08-20 ENCOUNTER — Ambulatory Visit (AMBULATORY_SURGERY_CENTER): Payer: Self-pay | Admitting: *Deleted

## 2020-08-20 VITALS — Ht 60.75 in | Wt 138.0 lb

## 2020-08-20 DIAGNOSIS — Z1211 Encounter for screening for malignant neoplasm of colon: Secondary | ICD-10-CM

## 2020-08-20 MED ORDER — SUPREP BOWEL PREP KIT 17.5-3.13-1.6 GM/177ML PO SOLN: 1.0000 | Freq: Once | ORAL | 0 refills | Status: AC

## 2020-08-20 NOTE — Progress Notes (Signed)
No egg or soy allergy known to patient  PONV  issues with past sedation with any surgeries or procedures No intubation problems in the past  No FH of Malignant Hyperthermia No diet pills per patient No home 02 use per patient  No blood thinners per patient  Pt denies issues with constipation  No A fib or A flutter  EMMI video to pt or via [place] 19 guidelines implemented in PV today with Pt and RN  Pt is fully vaccinated  for Covid  Pt denies loose or missing teeth, denies dentures, partials, dental implants, capped or bonded teeth  Suprep  Coupon given to pt in PV today , Code to Pharmacy and  NO PA's for preps discussed with pt In PV today  Discussed with pt there will be an out-of-pocket cost for prep and that varies from $0 to 70 dollars   Due to the COVID-19 pandemic we are asking patients to follow certain guidelines.  Pt aware of COVID protocols and [facility] guidelines

## 2020-09-02 ENCOUNTER — Encounter: Payer: Self-pay | Admitting: Gastroenterology

## 2020-09-03 ENCOUNTER — Ambulatory Visit (AMBULATORY_SURGERY_CENTER): Payer: Managed Care, Other (non HMO) | Admitting: Gastroenterology

## 2020-09-03 ENCOUNTER — Encounter: Payer: Self-pay | Admitting: Gastroenterology

## 2020-09-03 ENCOUNTER — Other Ambulatory Visit: Payer: Self-pay

## 2020-09-03 VITALS — BP 133/66 | HR 72 | Temp 98.4°F | Resp 16 | Ht 60.75 in | Wt 138.0 lb

## 2020-09-03 DIAGNOSIS — D120 Benign neoplasm of cecum: Secondary | ICD-10-CM

## 2020-09-03 DIAGNOSIS — D125 Benign neoplasm of sigmoid colon: Secondary | ICD-10-CM | POA: Diagnosis not present

## 2020-09-03 DIAGNOSIS — Z1211 Encounter for screening for malignant neoplasm of colon: Secondary | ICD-10-CM | POA: Diagnosis present

## 2020-09-03 MED ORDER — SODIUM CHLORIDE 0.9 % IV SOLN: 500.0000 mL | Freq: Once | INTRAVENOUS | Status: DC

## 2020-09-03 NOTE — Progress Notes (Signed)
Pt's states no medical or surgical changes since previsit or office visit.   VS taken by CW 

## 2020-09-03 NOTE — Patient Instructions (Signed)
YOU HAD AN ENDOSCOPIC PROCEDURE TODAY AT THE [facility]:   Refer to the procedure report that was given to you for any specific questions about what was found during the examination.  If the procedure report does not answer your questions, please call your gastroenterologist to clarify.  If you requested that your care partner not be given the details of your procedure findings, then the procedure report has been included in a sealed envelope for you to review at your convenience later.  YOU SHOULD EXPECT: Some feelings of bloating in the abdomen. Passage of more gas than usual.  Walking can help get rid of the air that was put into your GI tract during the procedure and reduce the bloating. If you had a lower endoscopy (such as a colonoscopy or flexible sigmoidoscopy) you may notice spotting of blood in your stool or on the toilet paper. If you underwent a bowel prep for your procedure, you may not have a normal bowel movement for a few days.  Please Note:  You might notice some irritation and congestion in your nose or some drainage.  This is from the oxygen used during your procedure.  There is no need for concern and it should clear up in a day or so.  SYMPTOMS TO REPORT IMMEDIATELY:   Following lower endoscopy (colonoscopy or flexible sigmoidoscopy):  Excessive amounts of blood in the stool  Significant tenderness or worsening of abdominal pains  Swelling of the abdomen that is new, acute  Fever of 100F or higher   Following upper endoscopy (EGD)  Vomiting of blood or coffee ground material  New chest pain or pain under the shoulder blades  Painful or persistently difficult swallowing  New shortness of breath  Fever of 100F or higher  Black, tarry-looking stools  For urgent or emergent issues, a gastroenterologist can be reached at any hour by calling [phone]. Do not use MyChart messaging for urgent concerns.    DIET:  We do recommend a small meal at first, but  then you may proceed to your regular diet.  Drink plenty of fluids but you should avoid alcoholic beverages for 24 hours.  ACTIVITY:  You should plan to take it easy for the rest of today and you should NOT DRIVE or use heavy machinery until tomorrow (because of the sedation medicines used during the test).    FOLLOW UP: Our staff will call the number listed on your records 48-72 hours following your procedure to check on you and address any questions or concerns that you may have regarding the information given to you following your procedure. If we do not reach you, we will leave a message.  We will attempt to reach you two times.  During this call, we will ask if you have developed any symptoms of COVID 19. If you develop any symptoms (ie: fever, flu-like symptoms, shortness of breath, cough etc.) before then, please call [phone].  If you test positive for Covid 19 in the 2 weeks post procedure, please call and report this information to us.    If any biopsies were taken you will be contacted by phone or by letter within the next 1-3 weeks.  Please call us at [phone] if you have not heard about the biopsies in 3 weeks.    SIGNATURES/CONFIDENTIALITY: You and/or your care partner have signed paperwork which will be entered into your electronic medical record.  These signatures attest to the fact that that the information above on   your After Visit Summary has been reviewed and is understood.  Full responsibility of the confidentiality of this discharge information lies with you and/or your care-partner. 

## 2020-09-03 NOTE — Progress Notes (Signed)
pt tolerated well. VSS. awake and to recovery. Report given to RN.  

## 2020-09-03 NOTE — Op Note (Signed)
[facility] Patient Name: Rose [name] Procedure Date: 09/03/2020 9:20 AM MRN: [id number] Endoscopist: Mauri Pole , MD Age: 50 Referring MD:  Date of Birth: 04/07/1971 Gender: Female Account #: [id number] Procedure:                Colonoscopy Indications:              Screening for colorectal malignant neoplasm Medicines:                Monitored Anesthesia Care Procedure:                Pre-Anesthesia Assessment:                           - Prior to the procedure, a History and Physical                            was performed, and patient medications and                            allergies were reviewed. The patient's tolerance of                            previous anesthesia was also reviewed. The risks                            and benefits of the procedure and the sedation                            options and risks were discussed with the patient.                            All questions were answered, and informed consent                            was obtained. Prior Anticoagulants: The patient has                            taken no previous anticoagulant or antiplatelet                            agents. ASA Grade Assessment: II - A patient with                            mild systemic disease. After reviewing the risks                            and benefits, the patient was deemed in                            satisfactory condition to undergo the procedure.                           After obtaining informed consent, the colonoscope  was passed under direct vision. Throughout the                            procedure, the patient's blood pressure, pulse, and                            oxygen saturations were monitored continuously. The                            Olympus [id number]-[id number]) Colonoscope was                            introduced through the anus and advanced to the the                             cecum, identified by appendiceal orifice and                            ileocecal valve. The colonoscopy was performed                            without difficulty. The patient tolerated the                            procedure well. The quality of the bowel                            preparation was good. The ileocecal valve,                            appendiceal orifice, and rectum were photographed. Scope In: 9:24:03 AM Scope Out: 9:44:35 AM Scope Withdrawal Time: 0 hours 12 minutes 28 seconds  Total Procedure Duration: 0 hours 20 minutes 32 seconds  Findings:                 The perianal and digital rectal examinations were                            normal.                           Two sessile polyps were found in the sigmoid colon                            and cecum. The polyps were 1 to 2 mm in size. These                            polyps were removed with a cold biopsy forceps.                            Resection and retrieval were complete.                           A few small-mouthed diverticula were found in the  sigmoid colon.                           Non-bleeding internal hemorrhoids were found during                            retroflexion. The hemorrhoids were small. Complications:            No immediate complications. Estimated Blood Loss:     Estimated blood loss: none. Impression:               - Two 1 to 2 mm polyps in the sigmoid colon and in                            the cecum, removed with a cold biopsy forceps.                            Resected and retrieved.                           - Diverticulosis in the sigmoid colon.                           - Non-bleeding internal hemorrhoids. Recommendation:           - Patient has a contact number available for                            emergencies. The signs and symptoms of potential                            delayed complications were discussed with the                             patient. Return to normal activities tomorrow.                            Written discharge instructions were provided to the                            patient.                           - Resume previous diet.                           - Continue present medications.                           - Await pathology results.                           - Repeat colonoscopy in 5-10 years for surveillance                            based on pathology results. Mauri Pole, MD 09/03/2020 9:51:14 AM This report has been signed electronically.

## 2020-09-03 NOTE — Progress Notes (Signed)
Called to room to assist during endoscopic procedure.  Patient ID and intended procedure confirmed with present staff. Received instructions for my participation in the procedure from the performing physician.  

## 2020-09-07 ENCOUNTER — Telehealth: Payer: Self-pay

## 2020-09-07 NOTE — Telephone Encounter (Signed)
  Follow up Call-  Call back number 09/03/2020  Post procedure Call Back phone  # [phone]  Permission to leave phone message Yes  Some recent data might be hidden     Patient questions:  Do you have a fever, pain , or abdominal swelling? No. Pain Score  0 *  Have you tolerated food without any problems? No.  Have you been able to return to your normal activities? Yes.    Do you have any questions about your discharge instructions: Diet   No. Medications  No. Follow up visit  No.  Do you have questions or concerns about your Care? Yes.    Actions: * If pain score is 4 or above: No action needed, pain <4.

## 2020-09-15 ENCOUNTER — Encounter: Payer: Self-pay | Admitting: Gastroenterology

## 2020-12-25 ENCOUNTER — Ambulatory Visit: Payer: Managed Care, Other (non HMO)

## 2020-12-25 ENCOUNTER — Ambulatory Visit: Payer: Managed Care, Other (non HMO) | Attending: Internal Medicine

## 2020-12-25 DIAGNOSIS — Z23 Encounter for immunization: Secondary | ICD-10-CM

## 2020-12-25 NOTE — Progress Notes (Signed)
   Covid-19 Vaccination Clinic  Name:  Rose [name]    MRN: [id number] DOB: 01-03-1971  12/25/2020  [name] was observed post Covid-19 immunization for 15 minutes without incident. She was provided with Vaccine Information Sheet and instruction to access the V-Safe system.   [name] was instructed to call 911 with any severe reactions post vaccine: Difficulty breathing  Swelling of face and throat  A fast heartbeat  A bad rash all over body  Dizziness and weakness   Immunizations Administered     Name Date Dose VIS Date Route   PFIZER Comrnaty(Gray TOP) Covid-19 Vaccine 12/25/2020 12:26 PM 0.3 mL 06/18/2020 Intramuscular   Manufacturer: [place]   Lot: [id number]   [place]: [phone]

## 2020-12-28 ENCOUNTER — Other Ambulatory Visit (HOSPITAL_BASED_OUTPATIENT_CLINIC_OR_DEPARTMENT_OTHER): Payer: Self-pay

## 2020-12-28 MED ORDER — COVID-19 MRNA VAC-TRIS(PFIZER) 30 MCG/0.3ML IM SUSP
INTRAMUSCULAR | 0 refills | Status: DC
  Filled 2020-12-28: qty 0.3, 1d supply, fill #0

## 2021-02-05 ENCOUNTER — Other Ambulatory Visit: Payer: Self-pay | Admitting: Obstetrics & Gynecology

## 2021-02-05 DIAGNOSIS — Z853 Personal history of malignant neoplasm of breast: Secondary | ICD-10-CM

## 2021-03-02 ENCOUNTER — Other Ambulatory Visit: Payer: Self-pay

## 2021-03-02 ENCOUNTER — Ambulatory Visit
Admission: RE | Admit: 2021-03-02 | Discharge: 2021-03-02 | Disposition: A | Discharge status: Home / Self Care | Payer: Managed Care, Other (non HMO) | Source: Ambulatory Visit | Attending: Obstetrics & Gynecology | Admitting: Obstetrics & Gynecology

## 2021-03-02 DIAGNOSIS — Z853 Personal history of malignant neoplasm of breast: Secondary | ICD-10-CM

## 2021-05-17 ENCOUNTER — Other Ambulatory Visit (HOSPITAL_COMMUNITY)
Admission: RE | Admit: 2021-05-17 | Discharge: 2021-05-17 | Disposition: A | Discharge status: Home / Self Care | Payer: Managed Care, Other (non HMO) | Source: Ambulatory Visit | Attending: Obstetrics & Gynecology | Admitting: Obstetrics & Gynecology

## 2021-05-17 ENCOUNTER — Other Ambulatory Visit: Payer: Self-pay

## 2021-05-17 ENCOUNTER — Ambulatory Visit (INDEPENDENT_AMBULATORY_CARE_PROVIDER_SITE_OTHER): Payer: Managed Care, Other (non HMO) | Admitting: Obstetrics & Gynecology

## 2021-05-17 ENCOUNTER — Encounter (HOSPITAL_BASED_OUTPATIENT_CLINIC_OR_DEPARTMENT_OTHER): Payer: Self-pay | Admitting: Obstetrics & Gynecology

## 2021-05-17 VITALS — BP 141/82 | HR 71 | Ht 60.5 in | Wt 138.2 lb

## 2021-05-17 DIAGNOSIS — M5430 Sciatica, unspecified side: Secondary | ICD-10-CM | POA: Insufficient documentation

## 2021-05-17 DIAGNOSIS — N898 Other specified noninflammatory disorders of vagina: Secondary | ICD-10-CM

## 2021-05-17 DIAGNOSIS — B009 Herpesviral infection, unspecified: Secondary | ICD-10-CM

## 2021-05-17 DIAGNOSIS — M0579 Rheumatoid arthritis with rheumatoid factor of multiple sites without organ or systems involvement: Secondary | ICD-10-CM

## 2021-05-17 DIAGNOSIS — D251 Intramural leiomyoma of uterus: Secondary | ICD-10-CM

## 2021-05-17 DIAGNOSIS — Z800 Family history of malignant neoplasm of digestive organs: Secondary | ICD-10-CM | POA: Diagnosis not present

## 2021-05-17 DIAGNOSIS — Z01419 Encounter for gynecological examination (general) (routine) without abnormal findings: Secondary | ICD-10-CM

## 2021-05-17 DIAGNOSIS — N946 Dysmenorrhea, unspecified: Secondary | ICD-10-CM | POA: Diagnosis not present

## 2021-05-17 DIAGNOSIS — D0511 Intraductal carcinoma in situ of right breast: Secondary | ICD-10-CM | POA: Diagnosis not present

## 2021-05-17 NOTE — Progress Notes (Signed)
50 y.o. G0P0 Married Rose [name] or [name] here for annual exam.  Doing well.  Having night seats.  Cycles are irregular a this point.  They are much lighter and can be darker.  She does occasionally have bright red bleeding.  When there is darker spotting, this can last 5 days.  The cycles with real bleeding will last 2 days and then have a few days of spotting.  Cramping is improved as well.  Using heating pad.  Has received hydrocodone for pain.  She still has 10 from the 20 I gave her last year.     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  This will finish 09/2022.  Seeing Dr. Lindi Adie.  Sexually active: Yes.    The current method of family planning is none.   Husband has ED and so has intercourse less frequently.   Exercising: Yes.     Teaches a Engineer, maintenance camp class, weights, walking Smoker:  no  Health Maintenance: Pap:  04/13/2020 Negative History of abnormal Pap:  no MMG:  03/02/2021 Colonoscopy:  09/03/2020 Screening Labs: Dr. Rogelia Mire.  Has appt in 2 weeks.   reports that she has never smoked. She has never used smokeless tobacco. She reports current alcohol use of about 5.0 - 6.0 standard drinks per week. She reports that she does not use drugs.  Past Medical History:  Diagnosis Date   Anemia    on Iron    Anxiety    Breast cancer ([place]) 05/17/2017   Right breast   Chronic rheumatic arthritis ([place])    Family history of breast cancer    Family history of colon cancer    Family history of pancreatic cancer    Malignant neoplasm of right breast in female, estrogen receptor positive ([place]) 05/25/2017   N&V (nausea and vomiting)    Personal history of radiation therapy    PONV (postoperative nausea and vomiting)    Poor appetite     Past Surgical History:  Procedure Laterality Date   APPENDECTOMY  4/12   BREAST BIOPSY Right 03/2017   BREAST LUMPECTOMY Right 06/2017   BREAST LUMPECTOMY WITH RADIOACTIVE SEED LOCALIZATION Right 06/27/2017   Procedure: RIGHT  BREAST LUMPECTOMY WITH RADIOACTIVE SEED LOCALIZATION;  Surgeon: Erroll Luna, MD;  Location: [facility];  Service: General;  Laterality: Right;   [place]  11/10   MEDIAL COLLATERAL LIGAMENT REPAIR, KNEE Left 05/04/2017   Procedure: LEFT THUMB  COLLATERAL LIGAMENT REPAIR;  Surgeon: Leanora Cover, MD;  Location: [facility];  Service: Orthopedics;  Laterality: Left;    Current Outpatient Medications  Medication Sig Dispense Refill   Abatacept 125 MG/ML SOAJ Inject into the skin every 30 (thirty) days.     acetaminophen (TYLENOL) 500 MG tablet Take 650 mg by mouth every 6 (six) hours as needed.      COVID-19 mRNA Vac-TriS, Pfizer, SUSP injection Inject into the muscle. 0.3 mL 0   Dapsone 5 % topical gel Apply topically daily.      fish oil-omega-3 fatty acids 1000 MG capsule Take 1,000 mg by mouth 2 (two) times daily.     fluticasone (FLONASE) 50 MCG/ACT nasal spray USE 2 SPRAYS IN EACH NOSTRIL ONCE DAILY AS NEEDED 30 DAYS  6   HYDROcodone-acetaminophen (NORCO) 5-325 MG tablet 1-2 tabs po q6 hours prn pain 20 tablet 0   hydroxychloroquine (PLAQUENIL) 200 MG tablet Take by mouth 2 (two) times daily.  methocarbamol (ROBAXIN) 500 MG tablet Take 1 tablet (500 mg total) by mouth every 8 (eight) hours as needed for muscle spasms. 12 tablet 0   Multiple Vitamin (ONCE DAILY PO) Take by mouth 1 day or 1 dose.     Prenatal 27-1 MG TABS TAKE 1 TABLET EVERY DAY 30 tablet 12   tamoxifen (NOLVADEX) 10 MG tablet Take 1 tablet (10 mg total) by mouth daily. 90 tablet 3   TRIAMCINOLONE ACETONIDE EX Apply topically. 0.17% cream     UNABLE TO FIND Clindamycin phosphate & tretinoin gel 1.2%     valACYclovir (VALTREX) 1000 MG tablet Take 0.5 tablets (500 mg total) by mouth as needed. 30 tablet 2   UNABLE TO FIND Prenatal plus (Patient not taking: Reported on 05/17/2021)     No current facility-administered medications for this visit.     Family History  Problem Relation Age of Onset   Breast cancer Maternal Aunt        70's   Pancreatic cancer Maternal Aunt 64   Multiple myeloma Mother    Other Other        PGF's sister- colon blockage    Colon cancer Cousin 54   Colon polyps Neg Hx    Esophageal cancer Neg Hx    Rectal cancer Neg Hx    Stomach cancer Neg Hx     Review of Systems  All other systems reviewed and are negative.  Exam:   BP (!) 141/82 (BP Location: Left Arm, Patient Position: Sitting, Cuff Size: Normal)   Pulse 71   Ht 5' 0.5" (1.537 m)   Wt 138 lb 3.2 oz (62.7 kg)   LMP 05/10/2021   BMI 26.55 kg/m   Height: 5' 0.5" (153.7 cm)  General appearance: alert, cooperative and appears stated age Head: Normocephalic, without obvious abnormality, atraumatic Neck: no adenopathy, supple, symmetrical, trachea midline and thyroid normal to inspection and palpation Lungs: clear to auscultation bilaterally Breasts: normal appearance, no masses or tenderness Heart: regular rate and rhythm Abdomen: soft, non-tender; bowel sounds normal; no masses,  no organomegaly Extremities: extremities normal, atraumatic, no cyanosis or edema Skin: Skin color, texture, turgor normal. No rashes or lesions Lymph nodes: Cervical, supraclavicular, and axillary nodes normal. No abnormal inguinal nodes palpated Neurologic: Grossly normal   Pelvic: External genitalia:  no lesions              Urethra:  normal appearing urethra with no masses, tenderness or lesions              Bartholins and Skenes: normal                 Vagina: normal appearing vagina with normal color, watery discharge with some clumpy discharge noted, no lesions              Cervix: no lesions              Pap taken: No. Bimanual Exam:  Uterus:  enlarged, 10-12  weeks size, mobile              Adnexa: normal adnexa               Rectovaginal: Confirms               Anus:  normal sphincter tone, no lesions  Chaperone, Octaviano Batty, CMA, was  present for exam.  Assessment/Plan: 1. Well woman exam with routine gynecological exam - pap and neg HR HPV 2021.  Not indicated today. - MMG  02/2021 - colonoscopy 2022, follow up 7 years - screening lab work planned with Dr. Rogelia Mire  2. Rheumatoid arthritis involving multiple sites with positive rheumatoid factor (HCC)  3. Dysmenorrhea - does not need RX at this time  4. Intramural leiomyoma of uterus  5. Ductal carcinoma in situ (DCIS) of right breast - on Tamoxifen - having yearly follow up with Dr. Lindi Adie  6. Family history of colon cancer, in cousin  33. HSV-2 (herpes simplex virus 2) infection - does not need valtrex RF at this time  8.  Vaginal discharge - ancillary swab obtained

## 2021-05-18 LAB — CERVICOVAGINAL ANCILLARY ONLY
Bacterial Vaginitis (gardnerella): NEGATIVE
Candida Glabrata: NEGATIVE
Candida Vaginitis: NEGATIVE
Comment: NEGATIVE
Comment: NEGATIVE
Comment: NEGATIVE

## 2021-05-19 ENCOUNTER — Other Ambulatory Visit: Payer: Self-pay | Admitting: Obstetrics & Gynecology

## 2021-05-19 DIAGNOSIS — Z01419 Encounter for gynecological examination (general) (routine) without abnormal findings: Secondary | ICD-10-CM

## 2021-06-25 ENCOUNTER — Other Ambulatory Visit (HOSPITAL_BASED_OUTPATIENT_CLINIC_OR_DEPARTMENT_OTHER): Payer: Self-pay

## 2021-06-25 ENCOUNTER — Ambulatory Visit: Payer: Managed Care, Other (non HMO) | Attending: Internal Medicine

## 2021-06-25 DIAGNOSIS — Z23 Encounter for immunization: Secondary | ICD-10-CM

## 2021-06-25 MED ORDER — PFIZER COVID-19 VAC BIVALENT 30 MCG/0.3ML IM SUSP
INTRAMUSCULAR | 0 refills | Status: DC
  Filled 2021-06-25: qty 0.3, 1d supply, fill #0

## 2021-06-25 NOTE — Progress Notes (Signed)
° °  Covid-19 Vaccination Clinic  Name:  Rose [name]    MRN: [id number] DOB: [birth year]  06/25/2021  [name] was observed post Covid-19 immunization for 15 minutes without incident. She was provided with Vaccine Information Sheet and instruction to access the V-Safe system.   [name] was instructed to call 911 with any severe reactions post vaccine: Difficulty breathing  Swelling of face and throat  A fast heartbeat  A bad rash all over body  Dizziness and weakness   Immunizations Administered     Name Date Dose VIS Date Route   Pfizer Covid-19 Vaccine Bivalent Booster 06/25/2021 10:47 AM 0.3 mL 03/10/2021 Intramuscular   Manufacturer: [place]   Lot: [id number]   [place]: [phone]

## 2021-06-30 NOTE — Assessment & Plan Note (Signed)
06/27/2018:Rt Lumpectomy: DCIS grade 1 spanning 0.3 cm, margins Neg, Intra ductal papilloma, UDH, CSL, Tis Nx Stage 0 Adjuvant radiation therapy2/10/2017-09/26/2017  Treatment plan: Adjuvant antiestrogen therapy with tamoxifen 10 mg daily times 5 yearsstarted 09/26/17  Tamoxifen Toxicities: 1.Hot flashes: Moderate to severe: I instructed her to take tamoxifen at bedtime 2.Hair thinning 3.Mood swings 4.  Vaginal discharge  Patient also experiencing leg cramps: She takes Robaxin as needed Rash on the right breast which comes on with stress. She is using triamcinolone cream which appears to help.   Breast Cancer Surveillance: 1. Breast Exam:07/01/21: Benign 2. 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 RTC in 1 year for follow up

## 2021-06-30 NOTE — Progress Notes (Signed)
Patient Care Team: Merrilee Seashore, MD as PCP - General (Internal Medicine) Nicholas Lose, MD as Consulting Physician (Hematology and Oncology) Erroll Luna, MD as Consulting Physician (General Surgery) Kyung Rudd, MD as Consulting Physician (Radiation Oncology)  DIAGNOSIS:    ICD-10-CM   1. Ductal carcinoma in situ (DCIS) of right breast  D05.11       SUMMARY OF ONCOLOGIC HISTORY: Oncology History  Ductal carcinoma in situ (DCIS) of right breast  04/20/2017 Breast MRI   Right breast middle to posterior depth 1.3 x 0.8 x 0.9 cm suspicious mass spiculated mass, several 4-5 mm enhancing foci, multiple bilateral enhancing foci that are less than 5 mm   05/08/2017 Initial Diagnosis   Right breast biopsy 830 position: DCIS with calcifications, low-grade, ER 95%, PR 90%, Tis N0 stage 0; right breast 10:00:PASH   05/25/2017 -  Anti-estrogen oral therapy   Tamoxifen 20 mg daily   06/19/2017 Genetic Testing   BRIP1 c.3103C>T (p.Arg1035Cys), MSH6 c.2667G>T (p.Gln889His) and NBN c.1684G>A (p.Val562Ile) VUSs found on the comprehensive Common Cancer panel.  The Comprehensive Common Cancer Panel offered by [facility] includes sequencing and/or deletion duplication testing of the following 46 genes: APC, ATM, AXIN2, BAP1, BARD1, BMPR1A, BRCA1, BRCA2, BRIP1, CDH1, CDK4, CDKN2A, CHEK2, EPCAM, FANCC, FH, FLCN, HOXB13, MET, MITF,  MLH1, MSH2, MSH6, MUTYH, NBN, NF1, NTHL1,  PALB2, PMS2, POLD1, POLE, POT1, PTEN, RAD51C, RAD51D, RECQL, SCG5/GREM1, SDHB, SDHC, SDHD, SMAD4, STK11, TP53, TSC1, TSC2, and VHL.   The report date is June 19, 2017. Update: BRIP1 c.3103C>T VUS has been reclassified from a variant of uncertain significance to a likely benign variant based on a combination of sources, e.g., internal data, published literature, population databases and in [place].  The report date is January 19, 2018.  MSH6 c.2667G>T VUS has been reclassified from a variant of uncertain significance to a  likely benign variant based on a combination of sources, e.g., internal data, published literature, population databases and in [place].  The report date is August 26, 2019.    06/27/2017 Surgery   Rt Lumpectomy: DCIS grade 1 spanning 0.3 cm, margins Neg, Intra ductal papilloma, UDH, CSL, Tis Nx Stage 0   08/14/2017 - 09/26/2017 Radiation Therapy   Adjuvant radiation therapy   10/28/2017 -  Anti-estrogen oral therapy   Tamoxifen 10 mg daily     CHIEF COMPLIANT: Follow-up of right breast DCIS on tamoxifen therapy  INTERVAL HISTORY: Rose [name] is a 50 y.o. with above-mentioned history of right breast DCIS treated with lumpectomy, radiation, and who is currently on tamoxifen 10 mg daily. Mammogram on 03/02/2021 showed no evidence of malignancy bilaterally. She presents to the clinic today for annual follow-up.  She continues to complain of hot flashes and joint stiffness as well as memory problems.  She however is able to handle the 10 mg of tamoxifen.  She will try taking it at bedtime and see if that makes a difference.  ALLERGIES:  has No Known Allergies.  MEDICATIONS:  Current Outpatient Medications  Medication Sig Dispense Refill   Abatacept 125 MG/ML SOAJ Inject into the skin every 30 (thirty) days.     acetaminophen (TYLENOL) 500 MG tablet Take 650 mg by mouth every 6 (six) hours as needed.      COVID-19 mRNA bivalent vaccine, Pfizer, (PFIZER COVID-19 VAC BIVALENT) injection Inject into the muscle. 0.3 mL 0   COVID-19 mRNA Vac-TriS, Pfizer, SUSP injection Inject into the muscle. 0.3 mL 0   Dapsone 5 % topical gel Apply topically  daily.      fish oil-omega-3 fatty acids 1000 MG capsule Take 1,000 mg by mouth 2 (two) times daily.     fluticasone (FLONASE) 50 MCG/ACT nasal spray USE 2 SPRAYS IN EACH NOSTRIL ONCE DAILY AS NEEDED 30 DAYS  6   HYDROcodone-acetaminophen (NORCO) 5-325 MG tablet 1-2 tabs po q6 hours prn pain 20 tablet 0   hydroxychloroquine (PLAQUENIL) 200 MG  tablet Take by mouth 2 (two) times daily.     methocarbamol (ROBAXIN) 500 MG tablet Take 1 tablet (500 mg total) by mouth every 8 (eight) hours as needed for muscle spasms. 12 tablet 0   Multiple Vitamin (ONCE DAILY PO) Take by mouth 1 day or 1 dose.     Prenatal 27-1 MG TABS TAKE 1 TABLET EVERY DAY 30 tablet 12   tamoxifen (NOLVADEX) 10 MG tablet Take 1 tablet (10 mg total) by mouth daily. 90 tablet 3   TRIAMCINOLONE ACETONIDE EX Apply topically. 0.17% cream     UNABLE TO FIND Clindamycin phosphate & tretinoin gel 1.2%     UNABLE TO FIND Prenatal plus (Patient not taking: Reported on 05/17/2021)     valACYclovir (VALTREX) 1000 MG tablet Take 0.5 tablets (500 mg total) by mouth as needed. 30 tablet 2   No current facility-administered medications for this visit.    PHYSICAL EXAMINATION: ECOG PERFORMANCE STATUS: 1 - Symptomatic but completely ambulatory  Vitals:   07/01/21 0842  BP: 129/82  Pulse: 89  Resp: 18  Temp: 97.9 F (36.6 C)  SpO2: 100%   Filed Weights   07/01/21 0842  Weight: 135 lb (61.2 kg)    BREAST: No palpable masses or nodules in either right or left breasts. No palpable axillary supraclavicular or infraclavicular adenopathy no breast tenderness or nipple discharge. (exam performed in the presence of a chaperone)  LABORATORY DATA:  I have reviewed the data as listed CMP Latest Ref Rng & Units 06/27/2017 11/04/2010  Glucose 65 - 99 mg/dL 93 90  BUN 6 - 20 mg/dL 9 7  Creatinine 0.44 - 1.00 mg/dL 0.83 0.78  Sodium 135 - 145 mmol/L 140 132(L)  Potassium 3.5 - 5.1 mmol/L 3.4(L) 3.7  Chloride 101 - 111 mmol/L 107 99  CO2 22 - 32 mmol/L 24 23  Calcium 8.9 - 10.3 mg/dL 9.2 9.1  Total Protein 6.5 - 8.1 g/dL 7.0 -  Total Bilirubin 0.3 - 1.2 mg/dL 1.0 -  Alkaline Phos 38 - 126 U/L 29(L) -  AST 15 - 41 U/L 26 -  ALT 14 - 54 U/L 15 -    Lab Results  Component Value Date   WBC 8.4 06/27/2017   HGB 13.4 06/27/2017   HCT 40.8 06/27/2017   MCV 89.5 06/27/2017   PLT  207 06/27/2017   NEUTROABS 2.9 06/27/2017    ASSESSMENT & PLAN:  Ductal carcinoma in situ (DCIS) of right breast 06/27/2018: Rt Lumpectomy: DCIS grade 1 spanning 0.3 cm, margins Neg, Intra ductal papilloma, UDH, CSL, Tis Nx Stage 0 Adjuvant radiation therapy 08/14/2017-09/26/2017   Treatment plan: Adjuvant antiestrogen therapy with tamoxifen 10 mg daily times 5 years started 09/26/17   Tamoxifen Toxicities: 1.  Hot flashes: Moderate to severe: I instructed her to take tamoxifen at bedtime 2.  Hair thinning 3.  Mood swings 4.  Vaginal discharge   Patient also experiencing leg cramps: She takes Robaxin as needed Rash on the right breast which comes on with stress.  She is using triamcinolone cream which appears to help.  Breast Cancer Surveillance: 1. Breast Exam:07/01/21: Benign 2. 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 We discussed lowering the dosage of tamoxifen to 5 mg daily but she does not want to do that.  She will try taking tamoxifen at bedtime and see if that makes a difference.  RTC in 1 year for follow up    No orders of the defined types were placed in this encounter.  The patient has a good understanding of the overall plan. she agrees with it. she will call with any problems that may develop before the next visit here.  Total time spent: 20 mins including face to face time and time spent for planning, charting and coordination of care  Rulon Eisenmenger, MD, MPH 07/01/2021  I, Thana Ates, am acting as scribe for Dr. Nicholas Lose.  I have reviewed the above documentation for accuracy and completeness, and I agree with the above.

## 2021-07-01 ENCOUNTER — Other Ambulatory Visit: Payer: Self-pay

## 2021-07-01 ENCOUNTER — Inpatient Hospital Stay: Payer: Managed Care, Other (non HMO) | Attending: Hematology and Oncology | Admitting: Hematology and Oncology

## 2021-07-01 DIAGNOSIS — R252 Cramp and spasm: Secondary | ICD-10-CM | POA: Diagnosis not present

## 2021-07-01 DIAGNOSIS — D0511 Intraductal carcinoma in situ of right breast: Secondary | ICD-10-CM | POA: Diagnosis present

## 2021-07-01 DIAGNOSIS — F439 Reaction to severe stress, unspecified: Secondary | ICD-10-CM | POA: Insufficient documentation

## 2021-07-01 DIAGNOSIS — N951 Menopausal and female climacteric states: Secondary | ICD-10-CM | POA: Insufficient documentation

## 2021-07-01 DIAGNOSIS — Z923 Personal history of irradiation: Secondary | ICD-10-CM | POA: Insufficient documentation

## 2021-07-01 DIAGNOSIS — R413 Other amnesia: Secondary | ICD-10-CM | POA: Insufficient documentation

## 2021-07-01 DIAGNOSIS — Z79810 Long term (current) use of selective estrogen receptor modulators (SERMs): Secondary | ICD-10-CM | POA: Diagnosis not present

## 2021-07-01 DIAGNOSIS — N898 Other specified noninflammatory disorders of vagina: Secondary | ICD-10-CM | POA: Diagnosis not present

## 2021-07-01 DIAGNOSIS — R21 Rash and other nonspecific skin eruption: Secondary | ICD-10-CM | POA: Insufficient documentation

## 2021-07-01 DIAGNOSIS — M2560 Stiffness of unspecified joint, not elsewhere classified: Secondary | ICD-10-CM | POA: Insufficient documentation

## 2021-07-01 MED ORDER — TAMOXIFEN CITRATE 10 MG PO TABS: 10.0000 mg | ORAL_TABLET | Freq: Every day | ORAL | 3 refills | Status: DC

## 2022-02-21 ENCOUNTER — Other Ambulatory Visit: Payer: Self-pay | Admitting: Obstetrics & Gynecology

## 2022-02-21 DIAGNOSIS — Z1231 Encounter for screening mammogram for malignant neoplasm of breast: Secondary | ICD-10-CM

## 2022-03-17 ENCOUNTER — Ambulatory Visit
Admission: RE | Admit: 2022-03-17 | Discharge: 2022-03-17 | Disposition: A | Discharge status: Home / Self Care | Payer: Managed Care, Other (non HMO) | Source: Ambulatory Visit | Attending: Obstetrics & Gynecology | Admitting: Obstetrics & Gynecology

## 2022-03-17 DIAGNOSIS — Z1231 Encounter for screening mammogram for malignant neoplasm of breast: Secondary | ICD-10-CM

## 2022-05-23 ENCOUNTER — Ambulatory Visit (INDEPENDENT_AMBULATORY_CARE_PROVIDER_SITE_OTHER): Payer: Managed Care, Other (non HMO) | Admitting: Obstetrics & Gynecology

## 2022-05-23 ENCOUNTER — Encounter (HOSPITAL_BASED_OUTPATIENT_CLINIC_OR_DEPARTMENT_OTHER): Payer: Self-pay | Admitting: Obstetrics & Gynecology

## 2022-05-23 VITALS — BP 126/79 | HR 74 | Ht 60.0 in | Wt 143.4 lb

## 2022-05-23 DIAGNOSIS — Z01419 Encounter for gynecological examination (general) (routine) without abnormal findings: Secondary | ICD-10-CM

## 2022-05-23 DIAGNOSIS — Z780 Asymptomatic menopausal state: Secondary | ICD-10-CM | POA: Diagnosis not present

## 2022-05-23 DIAGNOSIS — D0511 Intraductal carcinoma in situ of right breast: Secondary | ICD-10-CM

## 2022-05-23 DIAGNOSIS — Z800 Family history of malignant neoplasm of digestive organs: Secondary | ICD-10-CM

## 2022-05-23 DIAGNOSIS — Z803 Family history of malignant neoplasm of breast: Secondary | ICD-10-CM

## 2022-05-23 DIAGNOSIS — Z13820 Encounter for screening for osteoporosis: Secondary | ICD-10-CM

## 2022-05-23 DIAGNOSIS — D251 Intramural leiomyoma of uterus: Secondary | ICD-10-CM

## 2022-05-23 DIAGNOSIS — B009 Herpesviral infection, unspecified: Secondary | ICD-10-CM

## 2022-05-23 NOTE — Progress Notes (Signed)
51 y.o. G0P0 Married Rose [name] or [name] here for annual exam.  Doing well.  Father is now 64.  He and her sister just visited.    Has not had any bleeding since July.  Does have occasional feeling like cycle will start with some bloating but then it doesn't.    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  Seeing Dr. Lindi Adie.    No LMP recorded.          Sexually active: Yes.    The current method of family planning is post menopausal status.    Exercising: Yes.     Teaches boot camps, weights Smoker:  no  Health Maintenance: Pap:  04/13/2020 Negative History of abnormal Pap:  No MMG:  03/17/2022 Negative Colonoscopy:  09/03/2020, follow up 7 years.  Adenomatous polyp.   BMD:   2009 Screening Labs: Dr. Rogelia Mire and Dr. Amil Amen   reports that she has never smoked. She has never used smokeless tobacco. She reports current alcohol use of about 5.0 - 6.0 standard drinks of alcohol per week. She reports that she does not use drugs.  Past Medical History:  Diagnosis Date   Anemia    on Iron    Anxiety    Breast cancer ([place]) 05/17/2017   Right breast   Chronic rheumatic arthritis ([place])    Family history of breast cancer    Family history of colon cancer    Family history of pancreatic cancer    Malignant neoplasm of right breast in female, estrogen receptor positive ([place]) 05/25/2017   N&V (nausea and vomiting)    Personal history of radiation therapy    PONV (postoperative nausea and vomiting)    Poor appetite     Past Surgical History:  Procedure Laterality Date   APPENDECTOMY  4/12   BREAST BIOPSY Right 03/2017   BREAST LUMPECTOMY Right 06/2017   BREAST LUMPECTOMY WITH RADIOACTIVE SEED LOCALIZATION Right 06/27/2017   Procedure: RIGHT BREAST LUMPECTOMY WITH RADIOACTIVE SEED LOCALIZATION;  Surgeon: Erroll Luna, MD;  Location: [facility];  Service: General;  Laterality: Right;   [place]  11/10   MEDIAL  COLLATERAL LIGAMENT REPAIR, KNEE Left 05/04/2017   Procedure: LEFT THUMB  COLLATERAL LIGAMENT REPAIR;  Surgeon: Leanora Cover, MD;  Location: [facility];  Service: Orthopedics;  Laterality: Left;    Current Outpatient Medications  Medication Sig Dispense Refill   Abatacept 125 MG/ML SOAJ Inject into the skin every 30 (thirty) days.     acetaminophen (TYLENOL) 500 MG tablet Take 650 mg by mouth every 6 (six) hours as needed.      COVID-19 mRNA bivalent vaccine, Pfizer, (PFIZER COVID-19 VAC BIVALENT) injection Inject into the muscle. 0.3 mL 0   COVID-19 mRNA Vac-TriS, Pfizer, SUSP injection Inject into the muscle. 0.3 mL 0   Dapsone 5 % topical gel Apply topically daily.      fish oil-omega-3 fatty acids 1000 MG capsule Take 1,000 mg by mouth 2 (two) times daily.     fluticasone (FLONASE) 50 MCG/ACT nasal spray USE 2 SPRAYS IN EACH NOSTRIL ONCE DAILY AS NEEDED 30 DAYS  6   HYDROcodone-acetaminophen (NORCO) 5-325 MG tablet 1-2 tabs po q6 hours prn pain 20 tablet 0   hydroxychloroquine (PLAQUENIL) 200 MG tablet Take by mouth 2 (two) times daily.     meloxicam (MOBIC) 15 MG tablet Take 15 mg by mouth daily.     methocarbamol (ROBAXIN) 500 MG  tablet Take 1 tablet (500 mg total) by mouth every 8 (eight) hours as needed for muscle spasms. 12 tablet 0   Multiple Vitamin (ONCE DAILY PO) Take by mouth 1 day or 1 dose.     Prenatal 27-1 MG TABS TAKE 1 TABLET EVERY DAY 30 tablet 12   tamoxifen (NOLVADEX) 10 MG tablet Take 1 tablet (10 mg total) by mouth daily. 90 tablet 3   TRIAMCINOLONE ACETONIDE EX Apply topically. 0.17% cream     UNABLE TO FIND Clindamycin phosphate & tretinoin gel 1.2%     UNABLE TO FIND Prenatal plus     valACYclovir (VALTREX) 1000 MG tablet Take 0.5 tablets (500 mg total) by mouth as needed. 30 tablet 2   No current facility-administered medications for this visit.    Family History  Problem Relation Age of Onset   Breast cancer Maternal Aunt        70's    Pancreatic cancer Maternal Aunt 63   Multiple myeloma Mother    Other Other        PGF's sister- colon blockage    Colon cancer Cousin 52   Colon polyps Neg Hx    Esophageal cancer Neg Hx    Rectal cancer Neg Hx    Stomach cancer Neg Hx     ROS: Constitutional: negative Genitourinary:negative  Exam:   BP 126/79 (BP Location: Left Arm, Patient Position: Sitting, Cuff Size: Large)   Pulse 74   Ht 5' (1.524 m) Comment: Reported  Wt 143 lb 6.4 oz (65 kg)   BMI 28.01 kg/m   Height: 5' (152.4 cm) (Reported)  General appearance: alert, cooperative and appears stated age Head: Normocephalic, without obvious abnormality, atraumatic Neck: no adenopathy, supple, symmetrical, trachea midline and thyroid normal to inspection and palpation Lungs: clear to auscultation bilaterally Breasts: normal appearance, no masses or tenderness Heart: regular rate and rhythm Abdomen: soft, non-tender; bowel sounds normal; no masses,  no organomegaly Extremities: extremities normal, atraumatic, no cyanosis or edema Skin: Skin color, texture, turgor normal. No rashes or lesions Lymph nodes: Cervical, supraclavicular, and axillary nodes normal. No abnormal inguinal nodes palpated Neurologic: Grossly normal   Pelvic: External genitalia:  no lesions              Urethra:  normal appearing urethra with no masses, tenderness or lesions              Bartholins and Skenes: normal                 Vagina: normal appearing vagina with normal color and no discharge, no lesions              Cervix: no lesions              Pap taken: No. Bimanual Exam:  Uterus:  enlarged, 8 weeks size, globular              Adnexa: no mass, fullness, tenderness               Rectovaginal: Confirms               Anus:  normal sphincter tone, no lesions  Chaperone, Tonya Cassia, CMA, was present for exam.  Assessment/Plan: 1. Well woman exam with routine gynecological exam - Pap smear 04/2020 - Mammogram 03/2022 -  Colonoscopy 08/2020 - Bone mineral density ordered - lab work done with PCP, Dr. Ramachandran - vaccines reviewed/updated  2. Osteoporosis screening - DG BONE DENSITY (DXA); Future  3.   Postmenopausal  4. Ductal carcinoma in situ (DCIS) of right breast - On Tamoxifen and followed by Dr. Gudena  5. Family history of breast cancer  6. Intramural leiomyoma of uterus  7. Family history of colon cancer  8. HSV-2 (herpes simplex virus 2) infection - does not need valtrex RF   

## 2022-06-01 ENCOUNTER — Other Ambulatory Visit: Payer: Self-pay | Admitting: Obstetrics & Gynecology

## 2022-06-01 DIAGNOSIS — Z01419 Encounter for gynecological examination (general) (routine) without abnormal findings: Secondary | ICD-10-CM

## 2022-06-10 ENCOUNTER — Other Ambulatory Visit (HOSPITAL_BASED_OUTPATIENT_CLINIC_OR_DEPARTMENT_OTHER): Payer: Self-pay

## 2022-06-10 MED ORDER — COMIRNATY 30 MCG/0.3ML IM SUSY
PREFILLED_SYRINGE | INTRAMUSCULAR | 0 refills | Status: DC
  Filled 2022-06-10: qty 0.3, 1d supply, fill #0

## 2022-06-14 ENCOUNTER — Other Ambulatory Visit (HOSPITAL_BASED_OUTPATIENT_CLINIC_OR_DEPARTMENT_OTHER): Payer: Self-pay

## 2022-06-30 NOTE — Progress Notes (Signed)
Patient Care Team: Merrilee Seashore, MD as PCP - General (Internal Medicine) Nicholas Lose, MD as Consulting Physician (Hematology and Oncology) Erroll Luna, MD as Consulting Physician (General Surgery) Kyung Rudd, MD as Consulting Physician (Radiation Oncology)  DIAGNOSIS:  Encounter Diagnosis  Name Primary?   Ductal carcinoma in situ (DCIS) of right breast Yes    SUMMARY OF ONCOLOGIC HISTORY: Oncology History  Ductal carcinoma in situ (DCIS) of right breast  04/20/2017 Breast MRI   Right breast middle to posterior depth 1.3 x 0.8 x 0.9 cm suspicious mass spiculated mass, several 4-5 mm enhancing foci, multiple bilateral enhancing foci that are less than 5 mm   05/08/2017 Initial Diagnosis   Right breast biopsy 830 position: DCIS with calcifications, low-grade, ER 95%, PR 90%, Tis N0 stage 0; right breast 10:00:PASH   05/25/2017 -  Anti-estrogen oral therapy   Tamoxifen 20 mg daily   06/19/2017 Genetic Testing   BRIP1 c.3103C>T (p.Arg1035Cys), MSH6 c.2667G>T (p.Gln889His) and NBN c.1684G>A (p.Val562Ile) VUSs found on the comprehensive Common Cancer panel.  The Comprehensive Common Cancer Panel offered by [facility] includes sequencing and/or deletion duplication testing of the following 46 genes: APC, ATM, AXIN2, BAP1, BARD1, BMPR1A, BRCA1, BRCA2, BRIP1, CDH1, CDK4, CDKN2A, CHEK2, EPCAM, FANCC, FH, FLCN, HOXB13, MET, MITF,  MLH1, MSH2, MSH6, MUTYH, NBN, NF1, NTHL1,  PALB2, PMS2, POLD1, POLE, POT1, PTEN, RAD51C, RAD51D, RECQL, SCG5/GREM1, SDHB, SDHC, SDHD, SMAD4, STK11, TP53, TSC1, TSC2, and VHL.   The report date is June 19, 2017. Update: BRIP1 c.3103C>T VUS has been reclassified from a variant of uncertain significance to a likely benign variant based on a combination of sources, e.g., internal data, published literature, population databases and in [place].  The report date is January 19, 2018.  MSH6 c.2667G>T VUS has been reclassified from a variant of uncertain  significance to a likely benign variant based on a combination of sources, e.g., internal data, published literature, population databases and in [place].  The report date is August 26, 2019.    06/27/2017 Surgery   Rt Lumpectomy: DCIS grade 1 spanning 0.3 cm, margins Neg, Intra ductal papilloma, UDH, CSL, Tis Nx Stage 0   08/14/2017 - 09/26/2017 Radiation Therapy   Adjuvant radiation therapy   10/28/2017 -  Anti-estrogen oral therapy   Tamoxifen 10 mg daily     CHIEF COMPLIANT: Follow-up on Tamoxifen  INTERVAL HISTORY: Rose [name] is a 51 y.o. with above-mentioned history of right breast DCIS treated with lumpectomy, radiation, and who is currently on tamoxifen 10 mg daily. Mammogram on 03/02/2021 showed no evidence of malignancy bilaterally. She presents to the clinic today for annual follow-up.   She reports that hot flashes is not as bad. She says the discharge and the fogginess also is still there. She still irritated on right side breast.  ALLERGIES:  has No Known Allergies.  MEDICATIONS:  Current Outpatient Medications  Medication Sig Dispense Refill   Abatacept 125 MG/ML SOAJ Inject into the skin every 30 (thirty) days.     acetaminophen (TYLENOL) 500 MG tablet Take 650 mg by mouth every 6 (six) hours as needed.      COVID-19 mRNA bivalent vaccine, Pfizer, (PFIZER COVID-19 VAC BIVALENT) injection Inject into the muscle. 0.3 mL 0   COVID-19 mRNA Vac-TriS, Pfizer, SUSP injection Inject into the muscle. 0.3 mL 0   COVID-19 mRNA vaccine 2023-2024 (COMIRNATY) syringe Inject into the muscle. 0.3 mL 0   Dapsone 5 % topical gel Apply topically daily.      fish  oil-omega-3 fatty acids 1000 MG capsule Take 1,000 mg by mouth 2 (two) times daily.     fluticasone (FLONASE) 50 MCG/ACT nasal spray USE 2 SPRAYS IN EACH NOSTRIL ONCE DAILY AS NEEDED 30 DAYS  6   HYDROcodone-acetaminophen (NORCO) 5-325 MG tablet 1-2 tabs po q6 hours prn pain 20 tablet 0   hydroxychloroquine  (PLAQUENIL) 200 MG tablet Take by mouth 2 (two) times daily.     meloxicam (MOBIC) 15 MG tablet Take 15 mg by mouth daily.     methocarbamol (ROBAXIN) 500 MG tablet Take 1 tablet (500 mg total) by mouth every 8 (eight) hours as needed for muscle spasms. 12 tablet 0   Multiple Vitamin (ONCE DAILY PO) Take by mouth 1 day or 1 dose.     Prenatal 27-1 MG TABS TAKE 1 TABLET BY MOUTH EVERY DAY 30 tablet 12   tamoxifen (NOLVADEX) 10 MG tablet Take 1 tablet (10 mg total) by mouth daily. 90 tablet 3   TRIAMCINOLONE ACETONIDE EX Apply topically. 0.17% cream     UNABLE TO FIND Clindamycin phosphate & tretinoin gel 1.2%     UNABLE TO FIND Prenatal plus     valACYclovir (VALTREX) 1000 MG tablet Take 0.5 tablets (500 mg total) by mouth as needed. 30 tablet 2   No current facility-administered medications for this visit.    PHYSICAL EXAMINATION: ECOG PERFORMANCE STATUS: 1 - Symptomatic but completely ambulatory  Vitals:   07/01/22 0841  BP: 136/87  Pulse: 95  Resp: 18  Temp: 98.4 F (36.9 C)  SpO2: 100%   Filed Weights   07/01/22 0841  Weight: 140 lb 4.8 oz (63.6 kg)    BREAST: No palpable masses or nodules in either right or left breasts. No palpable axillary supraclavicular or infraclavicular adenopathy no breast tenderness or nipple discharge. (exam performed in the presence of a chaperone)  LABORATORY DATA:  I have reviewed the data as listed    Latest Ref Rng & Units 06/27/2017    6:50 AM 11/04/2010    3:44 PM  CMP  Glucose 65 - 99 mg/dL 93  90   BUN 6 - 20 mg/dL 9  7   Creatinine 0.44 - 1.00 mg/dL 0.83  0.78   Sodium 135 - 145 mmol/L 140  132   Potassium 3.5 - 5.1 mmol/L 3.4  3.7   Chloride 101 - 111 mmol/L 107  99   CO2 22 - 32 mmol/L 24  23   Calcium 8.9 - 10.3 mg/dL 9.2  9.1   Total Protein 6.5 - 8.1 g/dL 7.0    Total Bilirubin 0.3 - 1.2 mg/dL 1.0    Alkaline Phos 38 - 126 U/L 29    AST 15 - 41 U/L 26    ALT 14 - 54 U/L 15      Lab Results  Component Value Date   WBC  8.4 06/27/2017   HGB 13.4 06/27/2017   HCT 40.8 06/27/2017   MCV 89.5 06/27/2017   PLT 207 06/27/2017   NEUTROABS 2.9 06/27/2017    ASSESSMENT & PLAN:  Ductal carcinoma in situ (DCIS) of right breast 06/27/2018: Rt Lumpectomy: DCIS grade 1 spanning 0.3 cm, margins Neg, Intra ductal papilloma, UDH, CSL, Tis Nx Stage 0 Adjuvant radiation therapy 08/14/2017-09/26/2017   Treatment plan: Adjuvant antiestrogen therapy with tamoxifen 10 mg daily times 5 years started 09/26/17   Tamoxifen Toxicities: 1.  Hot flashes: Moderate to severe: I instructed her to take tamoxifen at bedtime 2.  Hair thinning 3.  Mood swings 4.  Vaginal discharge   Patient also experiencing leg cramps: She takes Robaxin as needed Rash on the right breast which comes on with stress.  She is using triamcinolone cream which appears to help.      Breast Cancer Surveillance: 1. Breast Exam:07/01/22: Benign 2. 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    No orders of the defined types were placed in this encounter.  The patient has a good understanding of the overall plan. she agrees with it. she will call with any problems that may develop before the next visit here. Total time spent: 30 mins including face to face time and time spent for planning, charting and co-ordination of care   Harriette Ohara, MD 07/01/22    I Gardiner Coins am acting as a Education administrator for [employer]  I have reviewed the above documentation for accuracy and completeness, and I agree with the above.

## 2022-07-01 ENCOUNTER — Inpatient Hospital Stay: Payer: Managed Care, Other (non HMO) | Attending: Hematology and Oncology | Admitting: Hematology and Oncology

## 2022-07-01 VITALS — BP 136/87 | HR 95 | Temp 98.4°F | Resp 18 | Ht 60.0 in | Wt 140.3 lb

## 2022-07-01 DIAGNOSIS — Z79810 Long term (current) use of selective estrogen receptor modulators (SERMs): Secondary | ICD-10-CM | POA: Insufficient documentation

## 2022-07-01 DIAGNOSIS — D0511 Intraductal carcinoma in situ of right breast: Secondary | ICD-10-CM

## 2022-07-01 DIAGNOSIS — Z923 Personal history of irradiation: Secondary | ICD-10-CM | POA: Diagnosis not present

## 2022-07-01 NOTE — Assessment & Plan Note (Addendum)
06/27/2018: Rt Lumpectomy: DCIS grade 1 spanning 0.3 cm, margins Neg, Intra ductal papilloma, UDH, CSL, Tis Nx Stage 0 Adjuvant radiation therapy 08/14/2017-09/26/2017   Treatment plan: Adjuvant antiestrogen therapy with tamoxifen 10 mg daily times 5 years started 09/26/17   Tamoxifen Toxicities: 1.  Hot flashes: Moderate to severe: I instructed her to take tamoxifen at bedtime 2.  Hair thinning 3.  Mood swings 4.  Vaginal discharge   Patient also experiencing leg cramps: She takes Robaxin as needed Rash on the right breast which comes on with stress.  She is using triamcinolone cream which appears to help.      Breast Cancer Surveillance: 1. Breast Exam:07/01/22: Benign 2. 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

## 2022-07-14 ENCOUNTER — Ambulatory Visit (HOSPITAL_BASED_OUTPATIENT_CLINIC_OR_DEPARTMENT_OTHER)
Admission: RE | Admit: 2022-07-14 | Discharge: 2022-07-14 | Disposition: A | Discharge status: Home / Self Care | Payer: Managed Care, Other (non HMO) | Source: Ambulatory Visit | Attending: Obstetrics & Gynecology | Admitting: Obstetrics & Gynecology

## 2022-07-14 DIAGNOSIS — Z780 Asymptomatic menopausal state: Secondary | ICD-10-CM | POA: Insufficient documentation

## 2022-07-14 DIAGNOSIS — Z13820 Encounter for screening for osteoporosis: Secondary | ICD-10-CM | POA: Diagnosis not present

## 2022-08-19 ENCOUNTER — Telehealth: Payer: Self-pay

## 2022-08-19 ENCOUNTER — Other Ambulatory Visit: Payer: Self-pay | Admitting: Hematology and Oncology

## 2022-08-19 NOTE — Telephone Encounter (Signed)
Called Pt regarding tamoxifen rx refill request. Received rx refill request but per MD most recent note Pt will complete 5 year course by 03/24. Spoke with Pt who states MD relayed that she could finish whatever rx she had left and not pick up more. Discussed with Pt that I will not refill rx. Patient verbalized understanding with no further questions.

## 2022-08-19 NOTE — Telephone Encounter (Signed)
See phone note 2/9

## 2023-03-16 ENCOUNTER — Other Ambulatory Visit: Payer: Self-pay | Admitting: Internal Medicine

## 2023-03-16 DIAGNOSIS — Z1231 Encounter for screening mammogram for malignant neoplasm of breast: Secondary | ICD-10-CM

## 2023-03-30 ENCOUNTER — Ambulatory Visit
Admission: RE | Admit: 2023-03-30 | Discharge: 2023-03-30 | Disposition: A | Discharge status: Home / Self Care | Payer: Managed Care, Other (non HMO) | Source: Ambulatory Visit | Attending: Internal Medicine | Admitting: Internal Medicine

## 2023-03-30 DIAGNOSIS — Z1231 Encounter for screening mammogram for malignant neoplasm of breast: Secondary | ICD-10-CM

## 2023-06-05 ENCOUNTER — Ambulatory Visit (HOSPITAL_BASED_OUTPATIENT_CLINIC_OR_DEPARTMENT_OTHER): Payer: Managed Care, Other (non HMO) | Admitting: Obstetrics & Gynecology

## 2023-06-28 ENCOUNTER — Other Ambulatory Visit (HOSPITAL_COMMUNITY)
Admission: RE | Admit: 2023-06-28 | Discharge: 2023-06-28 | Disposition: A | Discharge status: Home / Self Care | Payer: Managed Care, Other (non HMO) | Source: Ambulatory Visit | Attending: Obstetrics & Gynecology | Admitting: Obstetrics & Gynecology

## 2023-06-28 ENCOUNTER — Encounter (HOSPITAL_BASED_OUTPATIENT_CLINIC_OR_DEPARTMENT_OTHER): Payer: Self-pay | Admitting: Obstetrics & Gynecology

## 2023-06-28 ENCOUNTER — Ambulatory Visit (HOSPITAL_BASED_OUTPATIENT_CLINIC_OR_DEPARTMENT_OTHER): Payer: Managed Care, Other (non HMO) | Admitting: Obstetrics & Gynecology

## 2023-06-28 VITALS — BP 137/85 | HR 67 | Ht 61.0 in | Wt 139.2 lb

## 2023-06-28 DIAGNOSIS — N926 Irregular menstruation, unspecified: Secondary | ICD-10-CM

## 2023-06-28 DIAGNOSIS — B009 Herpesviral infection, unspecified: Secondary | ICD-10-CM

## 2023-06-28 DIAGNOSIS — Z01419 Encounter for gynecological examination (general) (routine) without abnormal findings: Secondary | ICD-10-CM | POA: Diagnosis not present

## 2023-06-28 DIAGNOSIS — Z800 Family history of malignant neoplasm of digestive organs: Secondary | ICD-10-CM | POA: Diagnosis not present

## 2023-06-28 DIAGNOSIS — Z124 Encounter for screening for malignant neoplasm of cervix: Secondary | ICD-10-CM

## 2023-06-28 DIAGNOSIS — M0579 Rheumatoid arthritis with rheumatoid factor of multiple sites without organ or systems involvement: Secondary | ICD-10-CM

## 2023-06-28 DIAGNOSIS — D251 Intramural leiomyoma of uterus: Secondary | ICD-10-CM

## 2023-06-28 MED ORDER — VALACYCLOVIR HCL 1 G PO TABS: 500.0000 mg | ORAL_TABLET | ORAL | 1 refills | Status: DC | PRN

## 2023-06-28 NOTE — Progress Notes (Signed)
52 y.o. G0P0 Married [place] American female here for annual exam.  Doing well.  Off Tamoxifen after 5 years.  Has been released from Dr. Pamelia Hoit at this point.    Menstrual cycles are much less frequent.  Last one was August and into September and lasted about three weeks.  It was very heavy but has been more than six months between that cycle and the prior one.  Will check FSH today.    Patient's last menstrual period was 03/12/2023 (approximate).          Sexually active: Yes.    The current method of family planning is none.     Smoker:  no  Health Maintenance: Pap:  04/13/2020 Negative History of abnormal Pap:  no MMG:  03/30/2023 Negative Colonoscopy:  09/03/2020  BMD:   07/14/2022 Normal Screening Labs: Does with Dr. Nicholos Johns   reports that she has never smoked. She has never used smokeless tobacco. She reports current alcohol use of about 5.0 - 6.0 standard drinks of alcohol per week. She reports that she does not use drugs.  Past Medical History:  Diagnosis Date   Anemia    on Iron    Anxiety    Breast cancer (HCC) 05/17/2017   Right breast   Chronic rheumatic arthritis (HCC)    Family history of breast cancer    Family history of colon cancer    Family history of pancreatic cancer    Malignant neoplasm of right breast in female, estrogen receptor positive (HCC) 05/25/2017   N&V (nausea and vomiting)    Personal history of radiation therapy    PONV (postoperative nausea and vomiting)    Poor appetite     Past Surgical History:  Procedure Laterality Date   APPENDECTOMY  4/12   BREAST BIOPSY Right 03/2017   BREAST LUMPECTOMY Right 06/2017   BREAST LUMPECTOMY WITH RADIOACTIVE SEED LOCALIZATION Right 06/27/2017   Procedure: RIGHT BREAST LUMPECTOMY WITH RADIOACTIVE SEED LOCALIZATION;  Surgeon: Harriette Bouillon, MD;  Location: [facility];  Service: General;  Laterality: Right;   COLPOSCOPY  1999   HYSTEROSCOPY WITH RESECTOSCOPE  11/10    MEDIAL COLLATERAL LIGAMENT REPAIR, KNEE Left 05/04/2017   Procedure: LEFT THUMB  COLLATERAL LIGAMENT REPAIR;  Surgeon: Betha Loa, MD;  Location: [facility];  Service: Orthopedics;  Laterality: Left;    Current Outpatient Medications  Medication Sig Dispense Refill   Abatacept 125 MG/ML SOAJ Inject into the skin every 30 (thirty) days.     acetaminophen (TYLENOL) 500 MG tablet Take 650 mg by mouth every 6 (six) hours as needed.      COVID-19 mRNA bivalent vaccine, Pfizer, (PFIZER COVID-19 VAC BIVALENT) injection Inject into the muscle. 0.3 mL 0   COVID-19 mRNA Vac-TriS, Pfizer, SUSP injection Inject into the muscle. 0.3 mL 0   COVID-19 mRNA vaccine 2023-2024 (COMIRNATY) syringe Inject into the muscle. 0.3 mL 0   Dapsone 5 % topical gel Apply topically daily.      fish oil-omega-3 fatty acids 1000 MG capsule Take 1,000 mg by mouth 2 (two) times daily.     fluticasone (FLONASE) 50 MCG/ACT nasal spray USE 2 SPRAYS IN EACH NOSTRIL ONCE DAILY AS NEEDED 30 DAYS  6   HYDROcodone-acetaminophen (NORCO) 5-325 MG tablet 1-2 tabs po q6 hours prn pain 20 tablet 0   hydroxychloroquine (PLAQUENIL) 200 MG tablet Take by mouth 2 (two) times daily.     meloxicam (MOBIC) 15 MG tablet Take 15 mg by mouth  daily.     methocarbamol (ROBAXIN) 500 MG tablet Take 1 tablet (500 mg total) by mouth every 8 (eight) hours as needed for muscle spasms. 12 tablet 0   Multiple Vitamin (ONCE DAILY PO) Take by mouth 1 day or 1 dose.     Prenatal 27-1 MG TABS TAKE 1 TABLET BY MOUTH EVERY DAY 30 tablet 12   TRIAMCINOLONE ACETONIDE EX Apply topically. 0.17% cream     UNABLE TO FIND Clindamycin phosphate & tretinoin gel 1.2%     UNABLE TO FIND Prenatal plus     valACYclovir (VALTREX) 1000 MG tablet Take 0.5 tablets (500 mg total) by mouth as needed. 30 tablet 2   tamoxifen (NOLVADEX) 10 MG tablet Take 1 tablet (10 mg total) by mouth daily. (Patient not taking: Reported on 06/28/2023) 90 tablet 3   No current  facility-administered medications for this visit.    Family History  Problem Relation Age of Onset   Breast cancer Maternal Aunt        70's   Pancreatic cancer Maternal Aunt 77   Multiple myeloma Mother    Other Other        PGF's sister- colon blockage    Colon cancer Cousin 27   Colon polyps Neg Hx    Esophageal cancer Neg Hx    Rectal cancer Neg Hx    Stomach cancer Neg Hx     ROS: Constitutional: negative Genitourinary:negative  Exam:   BP 137/85 (BP Location: Left Arm, Patient Position: Sitting, Cuff Size: Large)   Pulse 67   Ht 5\' 1"  (1.549 m)   Wt 139 lb 3.2 oz (63.1 kg)   LMP 03/12/2023 (Approximate)   BMI 26.30 kg/m   Height: 5\' 1"  (154.9 cm)  General appearance: alert, cooperative and appears stated age Head: Normocephalic, without obvious abnormality, atraumatic Neck: no adenopathy, supple, symmetrical, trachea midline and thyroid normal to inspection and palpation Lungs: clear to auscultation bilaterally Breasts: normal appearance, no masses or tenderness Heart: regular rate and rhythm Abdomen: soft, non-tender; bowel sounds normal; no masses,  no organomegaly Extremities: extremities normal, atraumatic, no cyanosis or edema Skin: Skin color, texture, turgor normal. No rashes or lesions Lymph nodes: Cervical, supraclavicular, and axillary nodes normal. No abnormal inguinal nodes palpated Neurologic: Grossly normal   Pelvic: External genitalia:  no lesions              Urethra:  normal appearing urethra with no masses, tenderness or lesions              Bartholins and Skenes: normal                 Vagina: normal appearing vagina with normal color and no discharge, no lesions              Cervix: no lesions              Pap taken: Yes.   Bimanual Exam:  Uterus:  enlarged, 10-12 weeks size, globular, mobile, non tender              Adnexa: normal adnexa               Rectovaginal: Confirms               Anus:  normal sphincter tone, no  lesions  Chaperone, Ina Homes, CMA, was present for exam.  Assessment/Plan: 1. Well woman exam with routine gynecological exam (Primary) - Pap and HR HPV smear obtained today - Mammogram 03/2023 -  Colonoscopy 09/03/2020 - Bone mineral density 07/14/2022 - lab work done with PCP, Dr. Nicholos Johns - vaccines reviewed/updated  2. Irregular menses - Follicle stimulating hormone  3. Cervical cancer screening - Cytology - PAP( [facility])  4. HSV-2 (herpes simplex virus 2) infection - RF for valtrex to pharmacy  5. Family history of colon cancer  6. Intramural leiomyoma of uterus  7. Rheumatoid arthritis involving multiple sites with positive rheumatoid factor (HCC)

## 2023-06-29 LAB — CYTOLOGY - PAP
Adequacy: ABSENT
Comment: NEGATIVE
Diagnosis: NEGATIVE
Diagnosis: REACTIVE
High risk HPV: NEGATIVE

## 2023-06-29 LAB — FOLLICLE STIMULATING HORMONE: FSH: 107 m[IU]/mL

## 2023-07-13 ENCOUNTER — Other Ambulatory Visit: Payer: Self-pay | Admitting: Obstetrics & Gynecology

## 2023-07-13 DIAGNOSIS — Z01419 Encounter for gynecological examination (general) (routine) without abnormal findings: Secondary | ICD-10-CM

## 2023-10-01 ENCOUNTER — Other Ambulatory Visit (HOSPITAL_BASED_OUTPATIENT_CLINIC_OR_DEPARTMENT_OTHER): Payer: Self-pay | Admitting: Obstetrics & Gynecology

## 2023-10-23 ENCOUNTER — Telehealth (HOSPITAL_BASED_OUTPATIENT_CLINIC_OR_DEPARTMENT_OTHER): Payer: Self-pay | Admitting: *Deleted

## 2023-10-23 ENCOUNTER — Other Ambulatory Visit (HOSPITAL_BASED_OUTPATIENT_CLINIC_OR_DEPARTMENT_OTHER): Payer: Self-pay | Admitting: Certified Nurse Midwife

## 2023-10-23 MED ORDER — PROGESTERONE MICRONIZED 100 MG PO CAPS: 100.0000 mg | ORAL_CAPSULE | Freq: Every day | ORAL | 12 refills | Status: DC

## 2023-10-23 NOTE — Telephone Encounter (Signed)
 Pt states that she was told to call if when her next cycle starts and Dr. Annabell Key was going to start her on progesterone to help with her heavy cycles. She states that her cycle started on 10/20/23.

## 2023-10-24 NOTE — Telephone Encounter (Signed)
 Informed pt that prescription for progesterone has been sent to pharmacy.

## 2024-03-18 ENCOUNTER — Other Ambulatory Visit: Payer: Self-pay | Admitting: Internal Medicine

## 2024-03-18 ENCOUNTER — Encounter: Payer: Self-pay | Admitting: Internal Medicine

## 2024-03-18 DIAGNOSIS — Z1231 Encounter for screening mammogram for malignant neoplasm of breast: Secondary | ICD-10-CM

## 2024-04-01 ENCOUNTER — Ambulatory Visit
Admission: RE | Admit: 2024-04-01 | Discharge: 2024-04-01 | Disposition: A | Discharge status: Home / Self Care | Source: Ambulatory Visit | Attending: Internal Medicine | Admitting: Internal Medicine

## 2024-04-01 DIAGNOSIS — Z1231 Encounter for screening mammogram for malignant neoplasm of breast: Secondary | ICD-10-CM

## 2024-07-18 ENCOUNTER — Ambulatory Visit (HOSPITAL_BASED_OUTPATIENT_CLINIC_OR_DEPARTMENT_OTHER): Payer: Self-pay | Admitting: Obstetrics & Gynecology

## 2024-08-06 ENCOUNTER — Other Ambulatory Visit (HOSPITAL_BASED_OUTPATIENT_CLINIC_OR_DEPARTMENT_OTHER): Payer: Self-pay

## 2024-08-06 ENCOUNTER — Telehealth (HOSPITAL_BASED_OUTPATIENT_CLINIC_OR_DEPARTMENT_OTHER): Payer: Self-pay

## 2024-08-06 DIAGNOSIS — Z01419 Encounter for gynecological examination (general) (routine) without abnormal findings: Secondary | ICD-10-CM

## 2024-08-06 MED ORDER — M-NATAL PLUS 27-1 MG PO TABS: 1.0000 | ORAL_TABLET | Freq: Every day | ORAL | 4 refills | Status: AC

## 2024-08-06 NOTE — Telephone Encounter (Signed)
 Spoke with patient. Advised rx for Prenatal 27-1 mg has been faxed to pharmacy on file. Patient is requesting to know where the delay came from stating the pharmacy states they have been sending a request since 07/15/2024. Apologized to patient letting her know I have not seen a request from the pharmacy thus far. Advised pharmacy may have the incorrect fax number and that I will call them to verify. Patient verbalizes understanding.

## 2024-08-06 NOTE — Telephone Encounter (Signed)
 Patient would like a refill on her Prenatal Vit-Fe Fumarate-FA (M-NATAL PLUS ) 27-1 MG TABS. States she been trying to get it filled since 07/15/24.

## 2024-08-14 ENCOUNTER — Encounter (HOSPITAL_BASED_OUTPATIENT_CLINIC_OR_DEPARTMENT_OTHER): Payer: Self-pay | Admitting: Obstetrics & Gynecology

## 2024-08-14 ENCOUNTER — Ambulatory Visit (INDEPENDENT_AMBULATORY_CARE_PROVIDER_SITE_OTHER): Admitting: Obstetrics & Gynecology

## 2024-08-14 VITALS — BP 140/80 | HR 75 | Ht 60.5 in | Wt 145.4 lb

## 2024-08-14 DIAGNOSIS — Z01419 Encounter for gynecological examination (general) (routine) without abnormal findings: Secondary | ICD-10-CM | POA: Diagnosis not present

## 2024-08-14 DIAGNOSIS — Z803 Family history of malignant neoplasm of breast: Secondary | ICD-10-CM | POA: Diagnosis not present

## 2024-08-14 DIAGNOSIS — M0579 Rheumatoid arthritis with rheumatoid factor of multiple sites without organ or systems involvement: Secondary | ICD-10-CM | POA: Diagnosis not present

## 2024-08-14 DIAGNOSIS — D251 Intramural leiomyoma of uterus: Secondary | ICD-10-CM | POA: Diagnosis not present

## 2024-08-14 DIAGNOSIS — Z800 Family history of malignant neoplasm of digestive organs: Secondary | ICD-10-CM | POA: Diagnosis not present

## 2024-08-14 DIAGNOSIS — B009 Herpesviral infection, unspecified: Secondary | ICD-10-CM | POA: Diagnosis not present

## 2024-08-14 DIAGNOSIS — D0511 Intraductal carcinoma in situ of right breast: Secondary | ICD-10-CM | POA: Diagnosis not present
